# Patient Record
Sex: Female | Born: 1965 | Race: White | Hispanic: No | Marital: Married | Smoking: Former smoker
Health system: Southern US, Community
[De-identification: ages and names within clinical notes are randomized; demographics above are authoritative.]

## PROBLEM LIST (undated history)

## (undated) DIAGNOSIS — F329 Major depressive disorder, single episode, unspecified: Secondary | ICD-10-CM

## (undated) DIAGNOSIS — F32A Depression, unspecified: Secondary | ICD-10-CM

## (undated) DIAGNOSIS — E669 Obesity, unspecified: Secondary | ICD-10-CM

## (undated) HISTORY — PX: TUBAL LIGATION: SHX77

## (undated) HISTORY — PX: APPENDECTOMY: SHX54

## (undated) HISTORY — DX: Depression, unspecified: F32.A

## (undated) HISTORY — PX: TONSILLECTOMY AND ADENOIDECTOMY: SUR1326

## (undated) HISTORY — DX: Major depressive disorder, single episode, unspecified: F32.9

## (undated) HISTORY — PX: NASAL SEPTUM SURGERY: SHX37

---

## 2015-04-19 ENCOUNTER — Ambulatory Visit (INDEPENDENT_AMBULATORY_CARE_PROVIDER_SITE_OTHER): Payer: 59 | Admitting: Internal Medicine

## 2015-04-19 ENCOUNTER — Other Ambulatory Visit: Payer: Self-pay | Admitting: Internal Medicine

## 2015-04-19 ENCOUNTER — Encounter: Payer: Self-pay | Admitting: Internal Medicine

## 2015-04-19 VITALS — BP 100/60 | HR 84 | Ht 68.0 in | Wt 199.2 lb

## 2015-04-19 DIAGNOSIS — F419 Anxiety disorder, unspecified: Secondary | ICD-10-CM | POA: Insufficient documentation

## 2015-04-19 DIAGNOSIS — E782 Mixed hyperlipidemia: Secondary | ICD-10-CM | POA: Diagnosis not present

## 2015-04-19 DIAGNOSIS — M79642 Pain in left hand: Secondary | ICD-10-CM

## 2015-04-19 DIAGNOSIS — F411 Generalized anxiety disorder: Secondary | ICD-10-CM | POA: Diagnosis not present

## 2015-04-19 DIAGNOSIS — L299 Pruritus, unspecified: Secondary | ICD-10-CM | POA: Insufficient documentation

## 2015-04-19 DIAGNOSIS — M79641 Pain in right hand: Secondary | ICD-10-CM | POA: Insufficient documentation

## 2015-04-19 DIAGNOSIS — R635 Abnormal weight gain: Secondary | ICD-10-CM | POA: Insufficient documentation

## 2015-04-19 MED ORDER — BUPROPION HCL ER (XL) 450 MG PO TB24: 450.0000 mg | ORAL_TABLET | Freq: Every day | ORAL | Status: DC

## 2015-04-19 MED ORDER — BUPROPION HCL ER (XL) 450 MG PO TB24: 300.0000 mg | ORAL_TABLET | Freq: Every day | ORAL | Status: DC

## 2015-04-19 MED ORDER — BUPROPION HCL ER (XL) 150 MG PO TB24: 450.0000 mg | ORAL_TABLET | Freq: Every day | ORAL | Status: DC

## 2015-04-19 NOTE — Progress Notes (Signed)
Date:  04/19/2015   Name:  Amy HowardDebbie Jenne   DOB:  [birth year]   MRN:  [id number]  Patient presents to establish care. [place]. She is up-to-date with health maintenance, Pap smear, and mammogram done in April 2016. She declines a flu vaccine today.   Chief Complaint: Establish Care Depression        This is a chronic problem.  The current episode started more than 1 year ago (started after her divorce).   Associated symptoms include decreased concentration, irritable and decreased interest.  Associated symptoms include no fatigue, no myalgias and not sad.     The symptoms are aggravated by work stress.  Treatments tried: on bupropion for many years.  (full workup in the past - suggested an abnormal protein in her serum.) Migraine  This is a recurrent problem. The current episode started more than 1 year ago. The problem occurs intermittently (a few times per year). Associated symptoms include photophobia. Pertinent negatives include no coughing, dizziness, numbness or tingling. She has tried acetaminophen for the symptoms. The treatment provided moderate relief. (Full workup in the past - suggested an abnormal protein in her serum.)  Hand Pain  There was no injury mechanism. The pain is present in the right fingers, left fingers, left hand and right hand. The quality of the pain is described as aching. The pain is mild. The pain has been intermittent since the incident. Pertinent negatives include no chest pain, numbness or tingling. She has tried acetaminophen and NSAIDs for the symptoms. The treatment provided mild (reports being tested at [facility] L[facility], RA, etc - all negative) relief.  Rash This is a recurrent problem. The problem has been waxing and waning since onset. The affected locations include the left arm and right arm. The rash is characterized by redness and itchiness. She was exposed to nothing. Pertinent negatives include no  cough, fatigue or shortness of breath. Treatments tried: multiple treatments over the years with no benefit. (Full workup in the past - suggested an abnormal protein in her serum.)    Review of Systems  Constitutional: Positive for unexpected weight change. Negative for fatigue.  Eyes: Positive for photophobia.  Respiratory: Negative for cough, chest tightness and shortness of breath.   Cardiovascular: Negative for chest pain, palpitations and leg swelling.  Genitourinary: Negative for vaginal bleeding, menstrual problem and dyspareunia.  Musculoskeletal: Positive for joint swelling and arthralgias. Negative for myalgias and gait problem.  Skin: Positive for rash. Negative for color change.  Neurological: Negative for dizziness, tingling, tremors, syncope and numbness.  Psychiatric/Behavioral: Positive for depression and decreased concentration. Negative for hallucinations and confusion. The patient is nervous/anxious. The patient is not hyperactive.     Patient Active Problem List   Diagnosis Date Noted  . Mixed hyperlipidemia 04/19/2015  . Weight gain 04/19/2015  . Acute anxiety 04/19/2015  . Bilateral hand pain 04/19/2015  . Generalized anxiety disorder 04/19/2015    Prior to Admission medications   Medication Sig Start Date End Date Taking? Authorizing Provider  buPROPion (WELLBUTRIN XL) 300 MG 24 hr tablet Take 300 mg by mouth daily.   Yes Historical Provider, MD    Allergies  Allergen Reactions  . Oxycontin [Oxycodone Hcl]     Past Surgical History  Procedure Laterality Date  . Appendectomy    . Tubal ligation    . Tonsillectomy and adenoidectomy    . Nasal septum surgery      Social History  Substance Use Topics  . Smoking status: Former Smoker    Quit date: 04/22/2004  . Smokeless tobacco: None  . Alcohol Use: 0.0 oz/week    0 Standard drinks or equivalent per week    Medication list has been reviewed and updated.   Physical Exam  Constitutional: She is  oriented to person, place, and time. She appears well-developed and well-nourished. She is irritable. No distress.  HENT:  Head: Normocephalic and atraumatic.  Eyes: Conjunctivae are normal. Right eye exhibits no discharge. Left eye exhibits no discharge. No scleral icterus.  Cardiovascular: Normal rate, regular rhythm and normal heart sounds.   Pulmonary/Chest: Effort normal and breath sounds normal. No respiratory distress. She has no wheezes.  Musculoskeletal: Normal range of motion. She exhibits no edema.  Neurological: She is alert and oriented to person, place, and time.  Skin: Skin is warm and dry. No rash noted. There is erythema (from scratching both forearms).  Psychiatric: Her speech is normal and behavior is normal. Thought content normal. Her mood appears anxious.  Nursing note and vitals reviewed.   BP 100/60 mmHg  Pulse 84  Ht  (1.727 m)  Wt 199 lb 3.2 oz (90.357 kg)  BMI 30.30 kg/m2  Assessment and Plan: 1. Mixed hyperlipidemia Will check at [facility] in April  2. Weight gain Encouraged exercise daily  3. Bilateral hand pain Suspect OA - advil or topical rubs as needed Full workup for RA, etc done in the past  4. Generalized anxiety disorder Will increase dose of bupropion Follow up in one month if no improvement; otherwise in April - buPROPion 450 MG TB24; Take 300 mg by mouth daily.  Dispense: 30 tablet; Refill: 5  5. Pruritus Possibly worsened by stress - will see if increased dose of bupropion is of benefit.   Bari Edward, MD [facility]  04/19/2015

## 2015-04-20 ENCOUNTER — Other Ambulatory Visit: Payer: Self-pay | Admitting: Internal Medicine

## 2015-04-20 MED ORDER — BUPROPION HCL ER (XL) 150 MG PO TB24: 450.0000 mg | ORAL_TABLET | Freq: Every day | ORAL | Status: DC

## 2015-07-25 ENCOUNTER — Ambulatory Visit (INDEPENDENT_AMBULATORY_CARE_PROVIDER_SITE_OTHER): Payer: 59 | Admitting: Internal Medicine

## 2015-07-25 ENCOUNTER — Encounter: Payer: Self-pay | Admitting: Internal Medicine

## 2015-07-25 VITALS — BP 118/80 | HR 82 | Temp 98.3°F | Ht 68.0 in | Wt 199.6 lb

## 2015-07-25 DIAGNOSIS — Z124 Encounter for screening for malignant neoplasm of cervix: Secondary | ICD-10-CM | POA: Diagnosis not present

## 2015-07-25 DIAGNOSIS — Z1239 Encounter for other screening for malignant neoplasm of breast: Secondary | ICD-10-CM | POA: Diagnosis not present

## 2015-07-25 DIAGNOSIS — Z0000 Encounter for general adult medical examination without abnormal findings: Secondary | ICD-10-CM | POA: Diagnosis not present

## 2015-07-25 DIAGNOSIS — E782 Mixed hyperlipidemia: Secondary | ICD-10-CM

## 2015-07-25 DIAGNOSIS — F988 Other specified behavioral and emotional disorders with onset usually occurring in childhood and adolescence: Secondary | ICD-10-CM

## 2015-07-25 DIAGNOSIS — F909 Attention-deficit hyperactivity disorder, unspecified type: Secondary | ICD-10-CM

## 2015-07-25 DIAGNOSIS — F411 Generalized anxiety disorder: Secondary | ICD-10-CM

## 2015-07-25 MED ORDER — ATOMOXETINE HCL 40 MG PO CAPS: 80.0000 mg | ORAL_CAPSULE | Freq: Every day | ORAL | Status: DC

## 2015-07-25 NOTE — Progress Notes (Signed)
Date:  07/25/2015   Name:  Amy Joyce   DOB:  1965/12/07   MRN:  [id number]   Chief Complaint: Annual Exam Amy Joyce is a 50 y.o. female who presents today for her Complete Annual Exam. She feels well. She reports exercising some. She reports she is sleeping well. She is due for a mammogram.  She does not recall if she had a Pap last year.  Pap smears have always been normal.  Anxiety Presents for follow-up (increased dose of bupropion to 450 mg last visit) visit. The problem has been waxing and waning. Symptoms include decreased concentration and nervous/anxious behavior. Patient reports no chest pain, dizziness, palpitations or shortness of breath. Symptoms occur most days.   Treatments tried: bupropion. Compliance with prior treatments has been good (she did not tolerate 450 mg - went back to 300 mg but does not think that it is helping.  ).  Migraine  This is a recurrent problem. The problem occurs intermittently. The problem has been unchanged. Associated symptoms include photophobia. Pertinent negatives include no abdominal pain, coughing, dizziness, fever or hearing loss. She has tried acetaminophen for the symptoms. The treatment provided moderate relief.   ADD - The patient reports that she was told she had ADHD by her previous physician. However she was only ever treated with Wellbutrin. There was some discussion that she might be treated with Vyvanse. She reports that her son also has ADHD and she is convinced that she's had it her whole life. She did poorly in school was distractible but her father did not pursue treatment at that time.  Review of Systems  Constitutional: Negative for fever, chills and fatigue.  HENT: Negative for hearing loss.   Eyes: Positive for photophobia. Negative for visual disturbance.  Respiratory: Negative for cough, chest tightness and shortness of breath.   Cardiovascular: Negative for chest pain, palpitations and leg swelling.    Gastrointestinal: Negative for abdominal pain, diarrhea and constipation.  Genitourinary: Negative for dysuria, frequency, vaginal bleeding, vaginal discharge, menstrual problem and pelvic pain.  Musculoskeletal: Positive for arthralgias. Negative for joint swelling and gait problem.  Skin: Negative for wound.  Neurological: Negative for dizziness and headaches.  Psychiatric/Behavioral: Positive for decreased concentration and agitation. Negative for sleep disturbance and dysphoric mood. The patient is nervous/anxious.     Patient Active Problem List   Diagnosis Date Noted  . Mixed hyperlipidemia 04/19/2015  . Weight gain 04/19/2015  . Acute anxiety 04/19/2015  . Bilateral hand pain 04/19/2015  . Generalized anxiety disorder 04/19/2015  . Pruritus 04/19/2015    Prior to Admission medications   Medication Sig Start Date End Date Taking? Authorizing Provider  buPROPion (WELLBUTRIN XL) 150 MG 24 hr tablet Take 3 tablets (450 mg total) by mouth daily. 04/20/15   Reubin Milan, MD  cholecalciferol (VITAMIN D) 1000 units tablet Take 1,000 Units by mouth daily.    Historical Provider, MD  magnesium oxide (MAG-OX) 400 MG tablet Take 400 mg by mouth daily.    Historical Provider, MD    Allergies  Allergen Reactions  . Oxycontin [Oxycodone Hcl]     Past Surgical History  Procedure Laterality Date  . Appendectomy    . Tubal ligation    . Tonsillectomy and adenoidectomy    . Nasal septum surgery      Social History  Substance Use Topics  . Smoking status: Former Smoker    Quit date: 04/22/2004  . Smokeless tobacco: Never Used  . Alcohol Use: 0.0  oz/week    0 Standard drinks or equivalent per week     Comment: occasional consumption     Medication list has been reviewed and updated.   Physical Exam  Constitutional: She is oriented to person, place, and time. She appears well-developed and well-nourished. No distress.  HENT:  Head: Normocephalic and atraumatic.  Right  Ear: Tympanic membrane and ear canal normal.  Left Ear: Tympanic membrane and ear canal normal.  Nose: Right sinus exhibits no maxillary sinus tenderness. Left sinus exhibits no maxillary sinus tenderness.  Mouth/Throat: Uvula is midline and oropharynx is clear and moist.  Eyes: Conjunctivae and EOM are normal. Right eye exhibits no discharge. Left eye exhibits no discharge. No scleral icterus.  Neck: Normal range of motion. Carotid bruit is not present. No erythema present. No thyromegaly present.  Cardiovascular: Normal rate, regular rhythm, normal heart sounds and normal pulses.   Pulmonary/Chest: Effort normal and breath sounds normal. No respiratory distress. She has no wheezes. Right breast exhibits no mass, no nipple discharge, no skin change and no tenderness. Left breast exhibits no mass, no nipple discharge, no skin change and no tenderness.  Abdominal: Soft. Bowel sounds are normal. There is no hepatosplenomegaly. There is no tenderness. There is no CVA tenderness.  Genitourinary: Vagina normal and uterus normal. No breast swelling, tenderness, discharge or bleeding. There is no tenderness, lesion or injury on the right labia. There is no tenderness, lesion or injury on the left labia. Cervix exhibits no motion tenderness, no discharge and no friability. Right adnexum displays no mass, no tenderness and no fullness. Left adnexum displays no mass, no tenderness and no fullness.  Musculoskeletal: She exhibits no edema or tenderness.  Lymphadenopathy:    She has no cervical adenopathy.    She has no axillary adenopathy.  Neurological: She is alert and oriented to person, place, and time. She has normal reflexes. No cranial nerve deficit or sensory deficit.  Skin: Skin is warm, dry and intact. No rash noted.  Psychiatric: Her speech is normal. Thought content normal. Her mood appears anxious. She is hyperactive.  Nursing note and vitals reviewed.   BP 118/80 mmHg  Pulse 82  Temp(Src)  98.3 F (36.8 C) (Oral)  Ht 5\' 8"  (1.727 m)  Wt 199 lb 9.6 oz (90.538 kg)  BMI 30.36 kg/m2  Assessment and Plan: 1. Annual physical exam Patient declines Hep C and HIV testing - CBC with Differential/Platelet - Comprehensive metabolic panel  2. Mixed hyperlipidemia - Lipid panel  3. Generalized anxiety disorder Decreased bupropion to 150 mg per day then d/c after one month - TSH  4. Breast cancer screening - MM DIGITAL SCREENING BILATERAL; Future  5. Pap smear for cervical cancer screening - Pap IG and HPV (high risk) DNA detection  6. ADD (attention deficit disorder) Wean off bupropion Begin strattera 40 mg daily for one week then 80 mg/d - atomoxetine (STRATTERA) 40 MG capsule; Take 2 capsules (80 mg total) by mouth daily.  Dispense: 60 capsule; Refill: 1   Bari EdwardLaura Vivi Piccirilli, MD [facility]  07/25/2015

## 2015-07-25 NOTE — Patient Instructions (Signed)
Breast Self-Awareness Practicing breast self-awareness may pick up problems early, prevent significant medical complications, and possibly save your life. By practicing breast self-awareness, you can become familiar with how your breasts look and feel and if your breasts are changing. This allows you to notice changes early. It can also offer you some reassurance that your breast health is good. One way to learn what is normal for your breasts and whether your breasts are changing is to do a breast self-exam. If you find a lump or something that was not present in the past, it is best to contact your caregiver right away. Other findings that should be evaluated by your caregiver include nipple discharge, especially if it is bloody; skin changes or reddening; areas where the skin seems to be pulled in (retracted); or new lumps and bumps. Breast pain is seldom associated with cancer (malignancy), but should also be evaluated by a caregiver. HOW TO PERFORM A BREAST SELF-EXAM The best time to examine your breasts is 5-7 days after your menstrual period is over. During menstruation, the breasts are lumpier, and it may be more difficult to pick up changes. If you do not menstruate, have reached menopause, or had your uterus removed (hysterectomy), you should examine your breasts at regular intervals, such as monthly. If you are breastfeeding, examine your breasts after a feeding or after using a breast pump. Breast implants do not decrease the risk for lumps or tumors, so continue to perform breast self-exams as recommended. Talk to your caregiver about how to determine the difference between the implant and breast tissue. Also, talk about the amount of pressure you should use during the exam. Over time, you will become more familiar with the variations of your breasts and more comfortable with the exam. A breast self-exam requires you to remove all your clothes above the waist. 1. Look at your breasts and nipples.  Stand in front of a mirror in a room with good lighting. With your hands on your hips, push your hands firmly downward. Look for a difference in shape, contour, and size from one breast to the other (asymmetry). Asymmetry includes puckers, dips, or bumps. Also, look for skin changes, such as reddened or scaly areas on the breasts. Look for nipple changes, such as discharge, dimpling, repositioning, or redness. 2. Carefully feel your breasts. This is best done either in the shower or tub while using soapy water or when flat on your back. Place the arm (on the side of the breast you are examining) above your head. Use the pads (not the fingertips) of your three middle fingers on your opposite hand to feel your breasts. Start in the underarm area and use  inch (2 cm) overlapping circles to feel your breast. Use 3 different levels of pressure (light, medium, and firm pressure) at each circle before moving to the next circle. The light pressure is needed to feel the tissue closest to the skin. The medium pressure will help to feel breast tissue a little deeper, while the firm pressure is needed to feel the tissue close to the ribs. Continue the overlapping circles, moving downward over the breast until you feel your ribs below your breast. Then, move one finger-width towards the center of the body. Continue to use the  inch (2 cm) overlapping circles to feel your breast as you move slowly up toward the collar bone (clavicle) near the base of the neck. Continue the up and down exam using all 3 pressures until you reach the   middle of the chest. Do this with each breast, carefully feeling for lumps or changes. 3.  Keep a written record with breast changes or normal findings for each breast. By writing this information down, you do not need to depend only on memory for size, tenderness, or location. Write down where you are in your menstrual cycle, if you are still menstruating. Breast tissue can have some lumps or  thick tissue. However, see your caregiver if you find anything that concerns you.  SEEK MEDICAL CARE IF:  You see a change in shape, contour, or size of your breasts or nipples.   You see skin changes, such as reddened or scaly areas on the breasts or nipples.   You have an unusual discharge from your nipples.   You feel a new lump or unusually thick areas.    This information is not intended to replace advice given to you by your health care provider. Make sure you discuss any questions you have with your health care provider.   Document Released: 04/08/2005 Document Revised: 03/25/2012 Document Reviewed: 07/24/2011 Elsevier Interactive Patient Education 2016 Elsevier Inc.  

## 2015-07-26 LAB — CBC WITH DIFFERENTIAL/PLATELET
Basophils Absolute: 0.1 10*3/uL (ref 0.0–0.2)
Basos: 1 %
EOS (ABSOLUTE): 0.2 10*3/uL (ref 0.0–0.4)
EOS: 4 %
Hematocrit: 40 % (ref 34.0–46.6)
Hemoglobin: 13.5 g/dL (ref 11.1–15.9)
IMMATURE GRANULOCYTES: 0 %
Immature Grans (Abs): 0 10*3/uL (ref 0.0–0.1)
Lymphocytes Absolute: 1.6 10*3/uL (ref 0.7–3.1)
Lymphs: 30 %
MCH: 29.3 pg (ref 26.6–33.0)
MCHC: 33.8 g/dL (ref 31.5–35.7)
MCV: 87 fL (ref 79–97)
MONOS ABS: 0.4 10*3/uL (ref 0.1–0.9)
Monocytes: 7 %
NEUTROS PCT: 58 %
Neutrophils Absolute: 3.2 10*3/uL (ref 1.4–7.0)
PLATELETS: 335 10*3/uL (ref 150–379)
RBC: 4.61 x10E6/uL (ref 3.77–5.28)
RDW: 13.8 % (ref 12.3–15.4)
WBC: 5.4 10*3/uL (ref 3.4–10.8)

## 2015-07-26 LAB — COMPREHENSIVE METABOLIC PANEL
ALT: 26 IU/L (ref 0–32)
AST: 21 IU/L (ref 0–40)
Albumin/Globulin Ratio: 1.8 (ref 1.2–2.2)
Albumin: 4.6 g/dL (ref 3.5–5.5)
Alkaline Phosphatase: 70 IU/L (ref 39–117)
BUN/Creatinine Ratio: 20 (ref 9–23)
BUN: 28 mg/dL — ABNORMAL HIGH (ref 6–24)
Bilirubin Total: 0.3 mg/dL (ref 0.0–1.2)
CALCIUM: 9.5 mg/dL (ref 8.7–10.2)
CO2: 25 mmol/L (ref 18–29)
CREATININE: 1.43 mg/dL — AB (ref 0.57–1.00)
Chloride: 104 mmol/L (ref 96–106)
GFR calc Af Amer: 49 mL/min/{1.73_m2} — ABNORMAL LOW (ref >59)
GFR, EST NON AFRICAN AMERICAN: 42 mL/min/{1.73_m2} — AB (ref >59)
Globulin, Total: 2.6 g/dL (ref 1.5–4.5)
Glucose: 91 mg/dL (ref 65–99)
POTASSIUM: 4.6 mmol/L (ref 3.5–5.2)
Sodium: 144 mmol/L (ref 134–144)
Total Protein: 7.2 g/dL (ref 6.0–8.5)

## 2015-07-26 LAB — LIPID PANEL
CHOL/HDL RATIO: 3.9 ratio (ref 0.0–4.4)
Cholesterol, Total: 248 mg/dL — ABNORMAL HIGH (ref 100–199)
HDL: 64 mg/dL (ref >39)
LDL CALC: 155 mg/dL — AB (ref 0–99)
TRIGLYCERIDES: 147 mg/dL (ref 0–149)
VLDL Cholesterol Cal: 29 mg/dL (ref 5–40)

## 2015-07-26 LAB — TSH: TSH: 2.67 u[IU]/mL (ref 0.450–4.500)

## 2015-07-28 ENCOUNTER — Telehealth: Payer: Self-pay

## 2015-07-28 LAB — PAP IG AND HPV HIGH-RISK: PAP SMEAR COMMENT: 0

## 2015-07-28 LAB — HPV, LOW VOLUME (REFLEX): HPV, LOW VOL REFLEX: NEGATIVE

## 2015-07-28 NOTE — Telephone Encounter (Signed)
Tried calling patient. Patient answered no service.

## 2015-07-28 NOTE — Telephone Encounter (Signed)
-----   Message from Reubin MilanLaura H Berglund, MD sent at 07/28/2015  8:00 AM EDT ----- Kidney function is slightly decreased - drink sufficient fluids to maintain hydration and schedule a follow up appt in 2 months.  All other labs are normal.  Cholesterol is slightly high but no medication is recommended at this time.

## 2015-08-11 NOTE — Telephone Encounter (Signed)
Tried to call patient repeatedly without service. Patient will contact office.

## 2015-08-23 ENCOUNTER — Telehealth: Payer: Self-pay

## 2015-08-23 DIAGNOSIS — F988 Other specified behavioral and emotional disorders with onset usually occurring in childhood and adolescence: Secondary | ICD-10-CM

## 2015-08-23 MED ORDER — ATOMOXETINE HCL 80 MG PO CAPS: 80.0000 mg | ORAL_CAPSULE | Freq: Every day | ORAL | Status: DC

## 2015-08-23 NOTE — Telephone Encounter (Signed)
Left mess 

## 2015-08-23 NOTE — Telephone Encounter (Signed)
Strattera 80 is working well. Does she stay at 3280 or will this go up? Wellbutrin 150 also well.  Is she supposed to stay on this or drop it since Wilhemena DurieStrattera is working well. Has Rx of 40 Strattera to take 2 daily if that's what you want her to do. All refills now that we send after that Rx will be mail order.  Call back number: [phone].

## 2015-08-23 NOTE — Telephone Encounter (Signed)
She can stop Wellbutrin and continue Strattera 80 mg.  She can take 2 40 mg until the mail order Rx arrives.

## 2015-08-25 NOTE — Telephone Encounter (Signed)
Patient upset as she called and got no call back on 08/24/2015. I explained I was out of office and apologized. She wanted to get a call back to see why she has to stop Wellbutrin while on Strattera and had a coupon that she can not use now that [facility] has mailed new Rx and caused her to get a bill of 175 dollars. I called [facility] and they said she can refuse the Rx when it arrives and after 5-10 days they will fix this and she can use the local pharmacy. Patient will call back if she does not have enough of the 40mg . [facility]

## 2015-08-28 ENCOUNTER — Other Ambulatory Visit: Payer: Self-pay | Admitting: Internal Medicine

## 2015-08-28 DIAGNOSIS — F988 Other specified behavioral and emotional disorders with onset usually occurring in childhood and adolescence: Secondary | ICD-10-CM

## 2015-08-28 MED ORDER — ATOMOXETINE HCL 80 MG PO CAPS: 80.0000 mg | ORAL_CAPSULE | Freq: Every day | ORAL | Status: DC

## 2015-09-13 ENCOUNTER — Telehealth: Payer: Self-pay

## 2015-09-13 NOTE — Telephone Encounter (Signed)
Advised per Dr. Judithann GravesBerglund can take Wellbutrin with Lyrica

## 2015-11-07 ENCOUNTER — Other Ambulatory Visit: Payer: Self-pay | Admitting: Internal Medicine

## 2016-02-16 ENCOUNTER — Other Ambulatory Visit: Payer: Self-pay | Admitting: Internal Medicine

## 2016-02-16 DIAGNOSIS — F988 Other specified behavioral and emotional disorders with onset usually occurring in childhood and adolescence: Secondary | ICD-10-CM

## 2016-02-19 NOTE — Telephone Encounter (Signed)
pts coming in on 5/2

## 2016-08-21 ENCOUNTER — Encounter: Payer: Self-pay | Admitting: Internal Medicine

## 2016-08-21 ENCOUNTER — Ambulatory Visit (INDEPENDENT_AMBULATORY_CARE_PROVIDER_SITE_OTHER): Payer: 59 | Admitting: Internal Medicine

## 2016-08-21 ENCOUNTER — Other Ambulatory Visit: Payer: Self-pay | Admitting: Internal Medicine

## 2016-08-21 VITALS — BP 116/68 | HR 82 | Ht 68.0 in | Wt 195.2 lb

## 2016-08-21 DIAGNOSIS — Z1239 Encounter for other screening for malignant neoplasm of breast: Secondary | ICD-10-CM

## 2016-08-21 DIAGNOSIS — Z6829 Body mass index (BMI) 29.0-29.9, adult: Secondary | ICD-10-CM

## 2016-08-21 DIAGNOSIS — Z1231 Encounter for screening mammogram for malignant neoplasm of breast: Secondary | ICD-10-CM | POA: Diagnosis not present

## 2016-08-21 DIAGNOSIS — F411 Generalized anxiety disorder: Secondary | ICD-10-CM

## 2016-08-21 DIAGNOSIS — Z0000 Encounter for general adult medical examination without abnormal findings: Secondary | ICD-10-CM | POA: Diagnosis not present

## 2016-08-21 DIAGNOSIS — E782 Mixed hyperlipidemia: Secondary | ICD-10-CM | POA: Diagnosis not present

## 2016-08-21 LAB — POCT URINALYSIS DIPSTICK
BILIRUBIN UA: NEGATIVE
Blood, UA: NEGATIVE
Glucose, UA: NEGATIVE
KETONES UA: NEGATIVE
Nitrite, UA: POSITIVE
PH UA: 6 (ref 5.0–8.0)
Protein, UA: NEGATIVE
SPEC GRAV UA: 1.01 (ref 1.010–1.025)
Urobilinogen, UA: 0.2 E.U./dL

## 2016-08-21 MED ORDER — PHENTERMINE HCL 37.5 MG PO TABS: 37.5000 mg | ORAL_TABLET | Freq: Every day | ORAL | 1 refills | Status: DC

## 2016-08-21 NOTE — Patient Instructions (Signed)
Breast Self-Awareness Breast self-awareness means being familiar with how your breasts look and feel. It involves checking your breasts regularly and reporting any changes to your health care provider. Practicing breast self-awareness is important. A change in your breasts can be a sign of a serious medical problem. Being familiar with how your breasts look and feel allows you to find any problems early, when treatment is more likely to be successful. All women should practice breast self-awareness, including women who have had breast implants. How to do a breast self-exam One way to learn what is normal for your breasts and whether your breasts are changing is to do a breast self-exam. To do a breast self-exam: Look for Changes   1. Remove all the clothing above your waist. 2. Stand in front of a mirror in a room with good lighting. 3. Put your hands on your hips. 4. Push your hands firmly downward. 5. Compare your breasts in the mirror. Look for differences between them (asymmetry), such as:  Differences in shape.  Differences in size.  Puckers, dips, and bumps in one breast and not the other. 6. Look at each breast for changes in your skin, such as:  Redness.  Scaly areas. 7. Look for changes in your nipples, such as:  Discharge.  Bleeding.  Dimpling.  Redness.  A change in position. Feel for Changes   Carefully feel your breasts for lumps and changes. It is best to do this while lying on your back on the floor and again while sitting or standing in the shower or tub with soapy water on your skin. Feel each breast in the following way:  Place the arm on the side of the breast you are examining above your head.  Feel your breast with the other hand.  Start in the nipple area and make  inch (2 cm) overlapping circles to feel your breast. Use the pads of your three middle fingers to do this. Apply light pressure, then medium pressure, then firm pressure. The light pressure  will allow you to feel the tissue closest to the skin. The medium pressure will allow you to feel the tissue that is a little deeper. The firm pressure will allow you to feel the tissue close to the ribs.  Continue the overlapping circles, moving downward over the breast until you feel your ribs below your breast.  Move one finger-width toward the center of the body. Continue to use the  inch (2 cm) overlapping circles to feel your breast as you move slowly up toward your collarbone.  Continue the up and down exam using all three pressures until you reach your armpit. Write Down What You Find   Write down what is normal for each breast and any changes that you find. Keep a written record with breast changes or normal findings for each breast. By writing this information down, you do not need to depend only on memory for size, tenderness, or location. Write down where you are in your menstrual cycle, if you are still menstruating. If you are having trouble noticing differences in your breasts, do not get discouraged. With time you will become more familiar with the variations in your breasts and more comfortable with the exam. How often should I examine my breasts? Examine your breasts every month. If you are breastfeeding, the best time to examine your breasts is after a feeding or after using a breast pump. If you menstruate, the best time to examine your breasts is 5-7 days   after your period is over. During your period, your breasts are lumpier, and it may be more difficult to notice changes. When should I see my health care provider? See your health care provider if you notice:  A change in shape or size of your breasts or nipples.  A change in the skin of your breast or nipples, such as a reddened or scaly area.  Unusual discharge from your nipples.  A lump or thick area that was not there before.  Pain in your breasts.  Anything that concerns you. This information is not intended to  replace advice given to you by your health care provider. Make sure you discuss any questions you have with your health care provider. Document Released: 04/08/2005 Document Revised: 09/14/2015 Document Reviewed: 02/26/2015 Elsevier Interactive Patient Education  2017 Elsevier Inc.  

## 2016-08-21 NOTE — Progress Notes (Signed)
Date:  08/21/2016   Name:  Amy Joyce   DOB:  09-10-1965   MRN:  [id number]   Chief Complaint: Annual Exam (breast exam. ) Amy Joyce is a 51 y.o. female who presents today for her Complete Annual Exam. She feels well. She reports exercising none. She reports she is sleeping fairly well.  She is overdue for mammogram - she denies breast issues.  She will try to schedule.  Anxiety  Patient reports no chest pain, dizziness, nervous/anxious behavior, palpitations or shortness of breath.    Weight - she is disturbed by her weight. She has tried [employer] for the past 2 months and has only lost a few pounds.  Her lower back aches all the time and she has been evaluated - instructed to lose weight.  Wt Readings from Last 3 Encounters:  08/21/16 195 lb 3.2 oz (88.5 kg)  07/25/15 199 lb 9.6 oz (90.5 kg)  04/19/15 199 lb 3.2 oz (90.4 kg)     Review of Systems  Constitutional: Negative for chills, fatigue and fever.  HENT: Negative for congestion, hearing loss, tinnitus, trouble swallowing and voice change.   Eyes: Negative for visual disturbance.  Respiratory: Negative for cough, chest tightness, shortness of breath and wheezing.   Cardiovascular: Negative for chest pain, palpitations and leg swelling.  Gastrointestinal: Negative for abdominal pain, constipation, diarrhea and vomiting.  Endocrine: Negative for polydipsia and polyuria.  Genitourinary: Negative for dysuria, frequency, genital sores, vaginal bleeding and vaginal discharge.  Musculoskeletal: Positive for back pain. Negative for arthralgias, gait problem and joint swelling.  Skin: Negative for color change and rash.  Neurological: Negative for dizziness, tremors, light-headedness and headaches.  Hematological: Negative for adenopathy. Does not bruise/bleed easily.  Psychiatric/Behavioral: Negative for dysphoric mood and sleep disturbance. The patient is not nervous/anxious.     Patient Active Problem List   Diagnosis  Date Noted  . ADD (attention deficit disorder) 07/25/2015  . Mixed hyperlipidemia 04/19/2015  . Weight gain 04/19/2015  . Acute anxiety 04/19/2015  . Bilateral hand pain 04/19/2015  . Generalized anxiety disorder 04/19/2015  . Pruritus 04/19/2015    Prior to Admission medications   Medication Sig Start Date End Date Taking? Authorizing Provider  Biotin w/ Vitamins C & E (HAIR/SKIN/NAILS PO) Take by mouth.   Yes Historical Provider, MD  buPROPion (WELLBUTRIN XL) 150 MG 24 hr tablet Take 3 tablets by mouth  daily 11/07/15  Yes Reubin Milan, MD  Calcium Citrate-Vitamin D (CALCIUM CITRATE + D3 PO) Take by mouth.   Yes Historical Provider, MD  Cyanocobalamin (VITAMIN B 12 PO) Take by mouth.   Yes Historical Provider, MD  magnesium oxide (MAG-OX) 400 MG tablet Take 500 mg by mouth 2 (two) times daily.    Yes Historical Provider, MD    Allergies  Allergen Reactions  . Oxycontin [Oxycodone Hcl] Itching    Past Surgical History:  Procedure Laterality Date  . APPENDECTOMY    . NASAL SEPTUM SURGERY    . TONSILLECTOMY AND ADENOIDECTOMY    . TUBAL LIGATION      Social History  Substance Use Topics  . Smoking status: Former Smoker    Quit date: 04/22/2004  . Smokeless tobacco: Never Used  . Alcohol use 0.0 oz/week     Comment: occasional consumption   Depression screen [facility] 2/9 08/21/2016  Decreased Interest 0  Down, Depressed, Hopeless 0  PHQ - 2 Score 0     Medication list has been reviewed and updated.  Physical Exam  Constitutional: She is oriented to person, place, and time. She appears well-developed and well-nourished. No distress.  HENT:  Head: Normocephalic and atraumatic.  Right Ear: Tympanic membrane and ear canal normal.  Left Ear: Tympanic membrane and ear canal normal.  Nose: Right sinus exhibits no maxillary sinus tenderness. Left sinus exhibits no maxillary sinus tenderness.  Mouth/Throat: Uvula is midline and oropharynx is clear and moist.  Eyes:  Conjunctivae and EOM are normal. Right eye exhibits no discharge. Left eye exhibits no discharge. No scleral icterus.  Neck: Normal range of motion. Carotid bruit is not present. No erythema present. No thyromegaly present.  Cardiovascular: Normal rate, regular rhythm, normal heart sounds and normal pulses.   Pulmonary/Chest: Effort normal. No respiratory distress. She has no wheezes. Right breast exhibits no mass, no nipple discharge, no skin change and no tenderness. Left breast exhibits no mass, no nipple discharge, no skin change and no tenderness.  Abdominal: Soft. Bowel sounds are normal. There is no hepatosplenomegaly. There is no tenderness. There is no CVA tenderness.  Musculoskeletal: Normal range of motion.  Lymphadenopathy:    She has no cervical adenopathy.    She has no axillary adenopathy.  Neurological: She is alert and oriented to person, place, and time. She has normal reflexes. No cranial nerve deficit or sensory deficit.  Skin: Skin is warm, dry and intact. No rash noted.  Psychiatric: She has a normal mood and affect. Her speech is normal and behavior is normal. Thought content normal.  Nursing note and vitals reviewed.   BP 116/68 (BP Location: Right Arm, Patient Position: Sitting, Cuff Size: Normal)   Pulse 82   Ht  (1.727 m)   Wt 195 lb 3.2 oz (88.5 kg)   SpO2 98%   BMI 29.68 kg/m   Assessment and Plan: 1. Annual physical exam Continue [employer] diet and begin exercise when able Colonoscopy and Pap are up to date UA shows asx bacteruria - CBC with Differential/Platelet - POCT urinalysis dipstick  2. Breast cancer screening Schedule mammogram  3. Generalized anxiety disorder Doing well on Welbutrin - TSH  4. Mixed hyperlipidemia Continue low fat diet - Comprehensive metabolic panel - Lipid panel  5. BMI 29.0-29.9,adult Trial of phentermine   Meds ordered this encounter  Medications  . phentermine (ADIPEX-P) 37.5 MG tablet    Sig: Take 1 tablet  (37.5 mg total) by mouth daily before breakfast.    Dispense:  30 tablet    Refill:  1    Bari Edward, MD [facility]  08/21/2016

## 2016-08-22 LAB — COMPREHENSIVE METABOLIC PANEL
A/G RATIO: 1.8 (ref 1.2–2.2)
ALBUMIN: 4.6 g/dL (ref 3.5–5.5)
ALT: 45 IU/L — AB (ref 0–32)
AST: 20 IU/L (ref 0–40)
Alkaline Phosphatase: 66 IU/L (ref 39–117)
BUN / CREAT RATIO: 21 (ref 9–23)
BUN: 22 mg/dL (ref 6–24)
Bilirubin Total: 0.3 mg/dL (ref 0.0–1.2)
CHLORIDE: 101 mmol/L (ref 96–106)
CO2: 25 mmol/L (ref 18–29)
Calcium: 9.8 mg/dL (ref 8.7–10.2)
Creatinine, Ser: 1.07 mg/dL — ABNORMAL HIGH (ref 0.57–1.00)
GFR, EST AFRICAN AMERICAN: 69 mL/min/{1.73_m2} (ref >59)
GFR, EST NON AFRICAN AMERICAN: 60 mL/min/{1.73_m2} (ref >59)
GLOBULIN, TOTAL: 2.6 g/dL (ref 1.5–4.5)
GLUCOSE: 83 mg/dL (ref 65–99)
POTASSIUM: 4.5 mmol/L (ref 3.5–5.2)
SODIUM: 141 mmol/L (ref 134–144)
TOTAL PROTEIN: 7.2 g/dL (ref 6.0–8.5)

## 2016-08-22 LAB — CBC WITH DIFFERENTIAL/PLATELET
BASOS ABS: 0 10*3/uL (ref 0.0–0.2)
Basos: 1 %
EOS (ABSOLUTE): 0.1 10*3/uL (ref 0.0–0.4)
Eos: 3 %
HEMOGLOBIN: 13.5 g/dL (ref 11.1–15.9)
Hematocrit: 40.2 % (ref 34.0–46.6)
Immature Grans (Abs): 0 10*3/uL (ref 0.0–0.1)
Immature Granulocytes: 0 %
LYMPHS ABS: 1.7 10*3/uL (ref 0.7–3.1)
Lymphs: 34 %
MCH: 28.7 pg (ref 26.6–33.0)
MCHC: 33.6 g/dL (ref 31.5–35.7)
MCV: 86 fL (ref 79–97)
MONOS ABS: 0.4 10*3/uL (ref 0.1–0.9)
Monocytes: 8 %
NEUTROS ABS: 2.8 10*3/uL (ref 1.4–7.0)
Neutrophils: 54 %
Platelets: 314 10*3/uL (ref 150–379)
RBC: 4.7 x10E6/uL (ref 3.77–5.28)
RDW: 14.1 % (ref 12.3–15.4)
WBC: 5 10*3/uL (ref 3.4–10.8)

## 2016-08-22 LAB — TSH: TSH: 4.11 u[IU]/mL (ref 0.450–4.500)

## 2016-08-22 LAB — LIPID PANEL
CHOL/HDL RATIO: 4.1 ratio (ref 0.0–4.4)
Cholesterol, Total: 215 mg/dL — ABNORMAL HIGH (ref 100–199)
HDL: 52 mg/dL (ref >39)
LDL Calculated: 123 mg/dL — ABNORMAL HIGH (ref 0–99)
Triglycerides: 198 mg/dL — ABNORMAL HIGH (ref 0–149)
VLDL Cholesterol Cal: 40 mg/dL (ref 5–40)

## 2016-10-02 ENCOUNTER — Encounter: Payer: Self-pay | Admitting: Internal Medicine

## 2016-10-02 ENCOUNTER — Ambulatory Visit (INDEPENDENT_AMBULATORY_CARE_PROVIDER_SITE_OTHER): Payer: 59 | Admitting: Internal Medicine

## 2016-10-02 ENCOUNTER — Ambulatory Visit
Admission: RE | Admit: 2016-10-02 | Discharge: 2016-10-02 | Disposition: A | Discharge status: Home / Self Care | Payer: 59 | Source: Ambulatory Visit | Attending: Internal Medicine | Admitting: Internal Medicine

## 2016-10-02 VITALS — BP 90/70 | HR 76 | Temp 98.2°F | Resp 17 | Ht 68.0 in | Wt 191.0 lb

## 2016-10-02 DIAGNOSIS — R635 Abnormal weight gain: Secondary | ICD-10-CM

## 2016-10-02 DIAGNOSIS — Z1239 Encounter for other screening for malignant neoplasm of breast: Secondary | ICD-10-CM

## 2016-10-02 DIAGNOSIS — Z1231 Encounter for screening mammogram for malignant neoplasm of breast: Secondary | ICD-10-CM | POA: Insufficient documentation

## 2016-10-02 MED ORDER — PHENTERMINE HCL 37.5 MG PO TABS: 37.5000 mg | ORAL_TABLET | Freq: Every day | ORAL | 1 refills | Status: DC

## 2016-10-02 NOTE — Progress Notes (Signed)
Date:  10/02/2016   Name:  Amy Joyce   DOB:  05/05/1965   MRN:  [id number]   Chief Complaint: Obesity Patient presents for follow up after starting medication for weight loss.   She has done well - continues on [employer] and exercise and has lost 5 lbs.  She denies side effects such as tachycardia, elevated BP or insomnia. We discussed continuing medication for 6 months.  Wt Readings from Last 3 Encounters:  10/02/16 191 lb (86.6 kg)  08/21/16 195 lb 3.2 oz (88.5 kg)  07/25/15 199 lb 9.6 oz (90.5 kg)       Review of Systems  Constitutional: Negative for chills, fatigue and fever.  Respiratory: Negative for chest tightness and shortness of breath.   Cardiovascular: Negative for chest pain and palpitations.  Gastrointestinal: Negative for abdominal pain.  Neurological: Negative for dizziness and headaches.  Psychiatric/Behavioral: Negative for sleep disturbance. The patient is not nervous/anxious.     Patient Active Problem List   Diagnosis Date Noted  . ADD (attention deficit disorder) 07/25/2015  . Mixed hyperlipidemia 04/19/2015  . Weight gain 04/19/2015  . Bilateral hand pain 04/19/2015  . Generalized anxiety disorder 04/19/2015  . Pruritus 04/19/2015    Prior to Admission medications   Medication Sig Start Date End Date Taking? Authorizing Provider  Biotin w/ Vitamins C & E (HAIR/SKIN/NAILS PO) Take by mouth.    [provider]  buPROPion (WELLBUTRIN XL) 150 MG 24 hr tablet Take 3 tablets by mouth  daily 11/07/15   Reubin Milan, MD  Calcium Citrate-Vitamin D (CALCIUM CITRATE + D3 PO) Take by mouth.    [provider]  Cyanocobalamin (VITAMIN B 12 PO) Take by mouth.    [provider]  magnesium oxide (MAG-OX) 400 MG tablet Take 500 mg by mouth 2 (two) times daily.     [provider]  phentermine (ADIPEX-P) 37.5 MG tablet Take 1 tablet (37.5 mg total) by mouth daily before breakfast. 08/21/16   Reubin Milan, MD     Allergies  Allergen Reactions  . Oxycontin [Oxycodone Hcl] Itching    Past Surgical History:  Procedure Laterality Date  . APPENDECTOMY    . NASAL SEPTUM SURGERY    . TONSILLECTOMY AND ADENOIDECTOMY    . TUBAL LIGATION      Social History  Substance Use Topics  . Smoking status: Former Smoker    Quit date: 04/22/2004  . Smokeless tobacco: Never Used  . Alcohol use 0.0 oz/week     Comment: occasional consumption     Medication list has been reviewed and updated.   Physical Exam  Constitutional: She is oriented to person, place, and time. She appears well-developed. No distress.  HENT:  Head: Normocephalic and atraumatic.  Neck: Normal range of motion. Neck supple.  Cardiovascular: Normal rate, regular rhythm and normal heart sounds.   Pulmonary/Chest: Effort normal and breath sounds normal. No respiratory distress. She has no wheezes.  Musculoskeletal: She exhibits no edema.  Neurological: She is alert and oriented to person, place, and time.  Skin: Skin is warm and dry. No rash noted.  Psychiatric: She has a normal mood and affect. Her behavior is normal. Thought content normal.  Nursing note and vitals reviewed.   BP 90/70 (BP Location: Right Arm, Patient Position: Sitting, Cuff Size: Normal)   Pulse 76   Temp 98.2 F (36.8 C) (Oral)   Resp 17   Ht 5\' 8"  (1.727 m)   Wt 191 lb (86.6  kg)   SpO2 97%   BMI 29.04 kg/m   Assessment and Plan: 1. Weight gain Steady loss noted on phentermine without side effects Wt check only in 2 months and refill   Meds ordered this encounter  Medications  . phentermine (ADIPEX-P) 37.5 MG tablet    Sig: Take 1 tablet (37.5 mg total) by mouth daily before breakfast.    Dispense:  30 tablet    Refill:  1    Bari EdwardLaura Deeann Servidio, MD [facility]  10/02/2016

## 2016-10-15 ENCOUNTER — Inpatient Hospital Stay
Admission: RE | Admit: 2016-10-15 | Discharge: 2016-10-15 | Disposition: A | Discharge status: Home / Self Care | Payer: Self-pay | Source: Ambulatory Visit | Attending: *Deleted | Admitting: *Deleted

## 2016-10-15 ENCOUNTER — Other Ambulatory Visit: Payer: Self-pay | Admitting: *Deleted

## 2016-10-15 DIAGNOSIS — Z9289 Personal history of other medical treatment: Secondary | ICD-10-CM

## 2016-12-04 ENCOUNTER — Ambulatory Visit (INDEPENDENT_AMBULATORY_CARE_PROVIDER_SITE_OTHER): Payer: 59 | Admitting: Internal Medicine

## 2016-12-04 ENCOUNTER — Encounter: Payer: Self-pay | Admitting: Internal Medicine

## 2016-12-04 VITALS — BP 112/76 | HR 91 | Ht 68.0 in | Wt 182.0 lb

## 2016-12-04 DIAGNOSIS — F411 Generalized anxiety disorder: Secondary | ICD-10-CM

## 2016-12-04 DIAGNOSIS — R635 Abnormal weight gain: Secondary | ICD-10-CM | POA: Diagnosis not present

## 2016-12-04 MED ORDER — PHENTERMINE HCL 37.5 MG PO TABS: 37.5000 mg | ORAL_TABLET | Freq: Every day | ORAL | 2 refills | Status: DC

## 2016-12-04 NOTE — Progress Notes (Signed)
Date:  12/04/2016   Name:  Amy Joyce   DOB:  1966/02/28   MRN:  [id number]   Chief Complaint: Weight Check (Pt has lost 9lbs. ) Anxiety  Presents for follow-up (taking welbutrin with good response) visit. Patient reports no chest pain, dizziness, nervous/anxious behavior, palpitations or shortness of breath. Symptoms occur occasionally. The quality of sleep is good.    Overweight Here to recheck weight and BP on phentermine.  She started in May and has done well.  Her starting weight was 199 lbs.  Weight today 182 lbs.  Total weight loss is almost 20 lbs.  She is very satisfied but wants to lose another 40 lbs.  No side effects to medication.    Review of Systems  Constitutional: Negative for chills, fatigue and fever.  Respiratory: Negative for chest tightness and shortness of breath.   Cardiovascular: Negative for chest pain and palpitations.  Gastrointestinal: Negative for abdominal pain.  Neurological: Negative for dizziness and headaches.  Psychiatric/Behavioral: Negative for sleep disturbance. The patient is not nervous/anxious.     Patient Active Problem List   Diagnosis Date Noted  . ADD (attention deficit disorder) 07/25/2015  . Mixed hyperlipidemia 04/19/2015  . Weight gain 04/19/2015  . Bilateral hand pain 04/19/2015  . Generalized anxiety disorder 04/19/2015  . Pruritus 04/19/2015    Prior to Admission medications   Medication Sig Start Date End Date Taking? Authorizing Provider  Biotin w/ Vitamins C & E (HAIR/SKIN/NAILS PO) Take by mouth.    [provider]  buPROPion (WELLBUTRIN XL) 150 MG 24 hr tablet Take 3 tablets by mouth  daily 11/07/15   Reubin Milan, MD  Calcium Citrate-Vitamin D (CALCIUM CITRATE + D3 PO) Take by mouth.    [provider]  Cyanocobalamin (VITAMIN B 12 PO) Take by mouth.    [provider]  magnesium oxide (MAG-OX) 400 MG tablet Take 500 mg by mouth 2 (two) times daily.     [provider]    phentermine (ADIPEX-P) 37.5 MG tablet Take 1 tablet (37.5 mg total) by mouth daily before breakfast. 10/02/16   Reubin Milan, MD    Allergies  Allergen Reactions  . Oxycontin [Oxycodone Hcl] Itching    Past Surgical History:  Procedure Laterality Date  . APPENDECTOMY    . NASAL SEPTUM SURGERY    . TONSILLECTOMY AND ADENOIDECTOMY    . TUBAL LIGATION      Social History  Substance Use Topics  . Smoking status: Former Smoker    Quit date: 04/22/2004  . Smokeless tobacco: Never Used  . Alcohol use 0.0 oz/week     Comment: occasional consumption     Medication list has been reviewed and updated.   Physical Exam  Constitutional: She is oriented to person, place, and time. She appears well-developed. No distress.  HENT:  Head: Normocephalic and atraumatic.  Neck: Normal range of motion. Neck supple.  Cardiovascular: Normal rate, regular rhythm and normal heart sounds.   Pulmonary/Chest: Effort normal and breath sounds normal. No respiratory distress. She has no wheezes.  Musculoskeletal: Normal range of motion. She exhibits no edema.  Neurological: She is alert and oriented to person, place, and time.  Skin: Skin is warm and dry. No rash noted.  Psychiatric: She has a normal mood and affect. Her behavior is normal. Thought content normal.  Nursing note and vitals reviewed.   BP 112/76   Pulse 91   Ht 5\' 8"  (1.727 m)   Wt 182  lb (82.6 kg)   SpO2 99%   BMI 27.67 kg/m   Assessment and Plan: 1. Weight gain Doing well with exercise and diet as well Will complete 6 mo tx after this next Rx - phentermine (ADIPEX-P) 37.5 MG tablet; Take 1 tablet (37.5 mg total) by mouth daily before breakfast.  Dispense: 30 tablet; Refill: 2  2. Generalized anxiety disorder Doing well on bupropion   Meds ordered this encounter  Medications  . phentermine (ADIPEX-P) 37.5 MG tablet    Sig: Take 1 tablet (37.5 mg total) by mouth daily before breakfast.    Dispense:  30 tablet     Refill:  2    Bari EdwardLaura Nicholaus Steinke, MD [facility]  12/04/2016

## 2017-01-09 ENCOUNTER — Other Ambulatory Visit: Payer: Self-pay | Admitting: Internal Medicine

## 2017-08-20 ENCOUNTER — Ambulatory Visit (INDEPENDENT_AMBULATORY_CARE_PROVIDER_SITE_OTHER): Payer: 59 | Admitting: Internal Medicine

## 2017-08-20 ENCOUNTER — Encounter: Payer: Self-pay | Admitting: Internal Medicine

## 2017-08-20 VITALS — BP 110/76 | HR 89 | Resp 16 | Ht 68.0 in | Wt 185.0 lb

## 2017-08-20 DIAGNOSIS — F411 Generalized anxiety disorder: Secondary | ICD-10-CM

## 2017-08-20 DIAGNOSIS — E782 Mixed hyperlipidemia: Secondary | ICD-10-CM

## 2017-08-20 DIAGNOSIS — Z1231 Encounter for screening mammogram for malignant neoplasm of breast: Secondary | ICD-10-CM

## 2017-08-20 DIAGNOSIS — Z0000 Encounter for general adult medical examination without abnormal findings: Secondary | ICD-10-CM | POA: Diagnosis not present

## 2017-08-20 DIAGNOSIS — Z6828 Body mass index (BMI) 28.0-28.9, adult: Secondary | ICD-10-CM | POA: Diagnosis not present

## 2017-08-20 DIAGNOSIS — Z1239 Encounter for other screening for malignant neoplasm of breast: Secondary | ICD-10-CM

## 2017-08-20 LAB — POCT URINALYSIS DIPSTICK
BILIRUBIN UA: NEGATIVE
Glucose, UA: NEGATIVE
Ketones, UA: NEGATIVE
Leukocytes, UA: NEGATIVE
NITRITE UA: NEGATIVE
PH UA: 5 (ref 5.0–8.0)
PROTEIN UA: NEGATIVE
RBC UA: NEGATIVE
Spec Grav, UA: 1.01 (ref 1.010–1.025)
UROBILINOGEN UA: 0.2 U/dL

## 2017-08-20 MED ORDER — PHENTERMINE HCL 37.5 MG PO CAPS: 37.5000 mg | ORAL_CAPSULE | ORAL | 2 refills | Status: DC

## 2017-08-20 NOTE — Patient Instructions (Signed)

## 2017-08-20 NOTE — Progress Notes (Signed)
Date:  08/20/2017   Name:  Amy Joyce   DOB:  [birth year]   MRN:  [id number]   Chief Complaint: Annual Exam and Nutrition Counseling (Wants Phentermine refills ) Amy Joyce is a 52 y.o. female who presents today for her Complete Annual Exam. She feels fairly well. She reports exercising none. She reports she is sleeping poorly. Mammogram is due.  She denies breast complaints.  Pap was done in 2017 - due in 2020.  Colonoscopy was done in 2016 - due again in 10 years.  She is still trying to lose weight.  She is not exercising but plans to start back swimming when the pool opens.  She did well last year with phentermine - lost 15 lbs and has mostly kept is off.  She would like to have it again for 6 months to help her reach her goal of 140 lbs. Anxiety  Presents for follow-up visit. Patient reports no chest pain, dizziness, nervous/anxious behavior, palpitations or shortness of breath. Symptoms occur rarely. The severity of symptoms is mild.        Review of Systems  Constitutional: Negative for chills, fatigue and fever.  HENT: Negative for congestion, hearing loss, tinnitus, trouble swallowing and voice change.   Eyes: Negative for visual disturbance.  Respiratory: Negative for cough, chest tightness, shortness of breath and wheezing.   Cardiovascular: Negative for chest pain, palpitations and leg swelling.  Gastrointestinal: Negative for abdominal pain, constipation, diarrhea and vomiting.  Endocrine: Negative for polydipsia and polyuria.  Genitourinary: Negative for dysuria, frequency, genital sores, vaginal bleeding and vaginal discharge.  Musculoskeletal: Negative for arthralgias, gait problem and joint swelling.  Skin: Negative for color change and rash.  Neurological: Negative for dizziness, tremors, light-headedness and headaches.  Hematological: Negative for adenopathy. Does not bruise/bleed easily.  Psychiatric/Behavioral: Negative for dysphoric mood and sleep  disturbance. The patient is not nervous/anxious.     Patient Active Problem List   Diagnosis Date Noted  . ADD (attention deficit disorder) 07/25/2015  . Mixed hyperlipidemia 04/19/2015  . Weight gain 04/19/2015  . Bilateral hand pain 04/19/2015  . Generalized anxiety disorder 04/19/2015  . Pruritus 04/19/2015    Prior to Admission medications   Medication Sig Start Date End Date Taking? Authorizing Provider  Biotin w/ Vitamins C & E (HAIR/SKIN/NAILS PO) Take by mouth.    [provider]  buPROPion (WELLBUTRIN XL) 150 MG 24 hr tablet TAKE 3 TABLETS BY MOUTH  DAILY 01/10/17   Reubin Milan, MD  Calcium Citrate-Vitamin D (CALCIUM CITRATE + D3 PO) Take by mouth.    [provider]  magnesium oxide (MAG-OX) 400 MG tablet Take 500 mg by mouth 2 (two) times daily.     [provider]    Allergies  Allergen Reactions  . Oxycontin [Oxycodone Hcl] Itching    Past Surgical History:  Procedure Laterality Date  . APPENDECTOMY    . NASAL SEPTUM SURGERY    . TONSILLECTOMY AND ADENOIDECTOMY    . TUBAL LIGATION      Social History   Tobacco Use  . Smoking status: Former Smoker    Last attempt to quit: 04/22/2004    Years since quitting: 13.3  . Smokeless tobacco: Never Used  Substance Use Topics  . Alcohol use: Yes    Alcohol/week: 0.0 oz    Comment: occasional consumption  . Drug use: No     Medication list has been reviewed and updated.  PHQ 2/9 Scores 08/20/2017 08/20/2017 08/21/2016  PHQ -  2 Score 1 0 0  PHQ- 9 Score 7 - -    Physical Exam  Constitutional: She is oriented to person, place, and time. She appears well-developed and well-nourished. No distress.  HENT:  Head: Normocephalic and atraumatic.  Right Ear: Tympanic membrane and ear canal normal.  Left Ear: Tympanic membrane and ear canal normal.  Nose: Right sinus exhibits no maxillary sinus tenderness. Left sinus exhibits no maxillary sinus tenderness.  Mouth/Throat: Uvula is midline  and oropharynx is clear and moist.  Eyes: Conjunctivae and EOM are normal. Right eye exhibits no discharge. Left eye exhibits no discharge. No scleral icterus.  Neck: Normal range of motion. Carotid bruit is not present. No erythema present. No thyromegaly present.  Cardiovascular: Normal rate, regular rhythm, normal heart sounds and normal pulses.  Pulmonary/Chest: Effort normal. No respiratory distress. She has no wheezes. Right breast exhibits no mass, no nipple discharge, no skin change and no tenderness. Left breast exhibits no mass, no nipple discharge, no skin change and no tenderness.  Abdominal: Soft. Bowel sounds are normal. There is no hepatosplenomegaly. There is no tenderness. There is no CVA tenderness.  Musculoskeletal: Normal range of motion.  Lymphadenopathy:    She has no cervical adenopathy.    She has no axillary adenopathy.  Neurological: She is alert and oriented to person, place, and time. She has normal reflexes. No cranial nerve deficit or sensory deficit.  Skin: Skin is warm, dry and intact. No rash noted.  Psychiatric: She has a normal mood and affect. Her speech is normal and behavior is normal. Thought content normal.  Nursing note and vitals reviewed.   BP 110/76   Pulse 89   Resp 16   Ht  (1.727 m)   Wt 185 lb (83.9 kg)   SpO2 97%   BMI 28.13 kg/m   Assessment and Plan: 1. Annual physical exam - CBC with Differential/Platelet - Comprehensive metabolic panel - POCT urinalysis dipstick  2. Breast cancer screening - MM DIGITAL SCREENING BILATERAL; Future  3. Generalized anxiety disorder Doing well on bupropion - TSH  4. Mixed hyperlipidemia Check labs - Lipid panel  5. BMI 28.0-28.9,adult Follow up BP and weight check in 3 months to get next refills - phentermine 37.5 MG capsule; Take 1 capsule (37.5 mg total) by mouth every morning.  Dispense: 30 capsule; Refill: 2   Meds ordered this encounter  Medications  . phentermine 37.5 MG  capsule    Sig: Take 1 capsule (37.5 mg total) by mouth every morning.    Dispense:  30 capsule    Refill:  2    Partially dictated using Animal nutritionist. Any errors are unintentional.  Bari Edward, MD [facility]  08/20/2017

## 2017-08-21 LAB — CBC WITH DIFFERENTIAL/PLATELET
BASOS ABS: 0 10*3/uL (ref 0.0–0.2)
Basos: 1 %
EOS (ABSOLUTE): 0.2 10*3/uL (ref 0.0–0.4)
Eos: 3 %
Hematocrit: 40.7 % (ref 34.0–46.6)
Hemoglobin: 13.6 g/dL (ref 11.1–15.9)
IMMATURE GRANS (ABS): 0 10*3/uL (ref 0.0–0.1)
Immature Granulocytes: 0 %
LYMPHS: 25 %
Lymphocytes Absolute: 1.5 10*3/uL (ref 0.7–3.1)
MCH: 29.4 pg (ref 26.6–33.0)
MCHC: 33.4 g/dL (ref 31.5–35.7)
MCV: 88 fL (ref 79–97)
MONOS ABS: 0.4 10*3/uL (ref 0.1–0.9)
Monocytes: 6 %
NEUTROS ABS: 3.9 10*3/uL (ref 1.4–7.0)
NEUTROS PCT: 65 %
PLATELETS: 311 10*3/uL (ref 150–379)
RBC: 4.62 x10E6/uL (ref 3.77–5.28)
RDW: 13.5 % (ref 12.3–15.4)
WBC: 6 10*3/uL (ref 3.4–10.8)

## 2017-08-21 LAB — LIPID PANEL
CHOLESTEROL TOTAL: 204 mg/dL — AB (ref 100–199)
Chol/HDL Ratio: 3.8 ratio (ref 0.0–4.4)
HDL: 54 mg/dL (ref >39)
LDL Calculated: 117 mg/dL — ABNORMAL HIGH (ref 0–99)
TRIGLYCERIDES: 165 mg/dL — AB (ref 0–149)
VLDL CHOLESTEROL CAL: 33 mg/dL (ref 5–40)

## 2017-08-21 LAB — COMPREHENSIVE METABOLIC PANEL
ALK PHOS: 65 IU/L (ref 39–117)
ALT: 23 IU/L (ref 0–32)
AST: 21 IU/L (ref 0–40)
Albumin/Globulin Ratio: 2.1 (ref 1.2–2.2)
Albumin: 4.6 g/dL (ref 3.5–5.5)
BILIRUBIN TOTAL: 0.3 mg/dL (ref 0.0–1.2)
BUN/Creatinine Ratio: 17 (ref 9–23)
BUN: 19 mg/dL (ref 6–24)
CO2: 23 mmol/L (ref 20–29)
Calcium: 9.2 mg/dL (ref 8.7–10.2)
Chloride: 104 mmol/L (ref 96–106)
Creatinine, Ser: 1.09 mg/dL — ABNORMAL HIGH (ref 0.57–1.00)
GFR calc non Af Amer: 58 mL/min/{1.73_m2} — ABNORMAL LOW (ref >59)
GFR, EST AFRICAN AMERICAN: 67 mL/min/{1.73_m2} (ref >59)
GLUCOSE: 68 mg/dL (ref 65–99)
Globulin, Total: 2.2 g/dL (ref 1.5–4.5)
POTASSIUM: 4.5 mmol/L (ref 3.5–5.2)
Sodium: 143 mmol/L (ref 134–144)
TOTAL PROTEIN: 6.8 g/dL (ref 6.0–8.5)

## 2017-08-21 LAB — TSH: TSH: 3.28 u[IU]/mL (ref 0.450–4.500)

## 2017-09-08 ENCOUNTER — Other Ambulatory Visit: Payer: Self-pay | Admitting: Internal Medicine

## 2017-09-08 ENCOUNTER — Telehealth: Payer: Self-pay

## 2017-09-08 DIAGNOSIS — R635 Abnormal weight gain: Secondary | ICD-10-CM

## 2017-09-08 MED ORDER — PHENTERMINE HCL 37.5 MG PO TABS: 37.5000 mg | ORAL_TABLET | Freq: Every day | ORAL | 1 refills | Status: DC

## 2017-09-08 NOTE — Telephone Encounter (Signed)
Patient wants tablets not capsules of Phentermine.  I have cancelled refills on the capsules and Dr.Berglund sent new tablets.  Patient aware and said she has 11 capsules left.

## 2017-09-28 ENCOUNTER — Ambulatory Visit
Admission: EM | Admit: 2017-09-28 | Discharge: 2017-09-28 | Disposition: A | Discharge status: Home / Self Care | Payer: 59 | Attending: Family Medicine | Admitting: Family Medicine

## 2017-09-28 ENCOUNTER — Other Ambulatory Visit: Payer: Self-pay

## 2017-09-28 DIAGNOSIS — N12 Tubulo-interstitial nephritis, not specified as acute or chronic: Secondary | ICD-10-CM | POA: Diagnosis not present

## 2017-09-28 LAB — URINALYSIS, COMPLETE (UACMP) WITH MICROSCOPIC
Bilirubin Urine: NEGATIVE
GLUCOSE, UA: NEGATIVE mg/dL
Hgb urine dipstick: NEGATIVE
Ketones, ur: NEGATIVE mg/dL
Nitrite: POSITIVE — AB
PH: 8.5 — AB (ref 5.0–8.0)
Protein, ur: NEGATIVE mg/dL
RBC / HPF: NONE SEEN RBC/hpf (ref 0–5)
Specific Gravity, Urine: 1.015 (ref 1.005–1.030)

## 2017-09-28 MED ORDER — HYDROCODONE-ACETAMINOPHEN 5-325 MG PO TABS: 2.0000 | ORAL_TABLET | Freq: Three times a day (TID) | ORAL | 0 refills | Status: DC | PRN

## 2017-09-28 MED ORDER — SULFAMETHOXAZOLE-TRIMETHOPRIM 800-160 MG PO TABS: 1.0000 | ORAL_TABLET | Freq: Two times a day (BID) | ORAL | 0 refills | Status: DC

## 2017-09-28 NOTE — Discharge Instructions (Signed)
Antibiotic as prescribed.  Take care  Dr. Jiali Linney  

## 2017-09-28 NOTE — ED Provider Notes (Signed)
[facility]  CSN: [id number] Arrival date & time: 09/28/17  1001  History   Chief Complaint Chief Complaint  Patient presents with  . Back Pain   HPI  52 year old female presents with the above complaint.  Patient reports right-sided mid to lower back pain.  She states it is been going on for the past 2 weeks.  Worsened as of last night.  Pain is severe.  She is unsure of the inciting factor.  She states that it may be from her recent lifting of her dog.  Pain is currently 6/10 in severity.  Patient states that she has no urinary symptoms -dysuria, urgency, frequency.  She has had some nausea.  No vomiting.  No fever.  No chills.  No abdominal pain.  Patient is concerned given the location that she may have a kidney infection.  No relieving factors.  Pain seems to be worse with activity.  No other associated symptoms.  No other complaints.  Past Medical History:  Diagnosis Date  . Depression    Patient Active Problem List   Diagnosis Date Noted  . ADD (attention deficit disorder) 07/25/2015  . Mixed hyperlipidemia 04/19/2015  . Weight gain 04/19/2015  . Bilateral hand pain 04/19/2015  . Generalized anxiety disorder 04/19/2015  . Pruritus 04/19/2015   Past Surgical History:  Procedure Laterality Date  . APPENDECTOMY    . NASAL SEPTUM SURGERY    . TONSILLECTOMY AND ADENOIDECTOMY    . TUBAL LIGATION     OB History   None    Home Medications    Prior to Admission medications   Medication Sig Start Date End Date Taking? Authorizing Provider  Biotin w/ Vitamins C & E (HAIR/SKIN/NAILS PO) Take by mouth.    [provider]  buPROPion (WELLBUTRIN XL) 150 MG 24 hr tablet TAKE 3 TABLETS BY MOUTH  DAILY 01/10/17   Glean Hess, MD  Calcium Citrate-Vitamin D (CALCIUM CITRATE + D3 PO) Take by mouth.    [provider]  HYDROcodone-acetaminophen (NORCO/VICODIN) 5-325 MG tablet Take 2 tablets by mouth every 8 (eight) hours as needed. 09/28/17   Coral Spikes, DO  magnesium oxide (MAG-OX) 400 MG tablet Take 500 mg by mouth 2 (two) times daily.     [provider]  phentermine (ADIPEX-P) 37.5 MG tablet Take 1 tablet (37.5 mg total) by mouth daily before breakfast. 09/08/17   Glean Hess, MD  sulfamethoxazole-trimethoprim (BACTRIM DS,SEPTRA DS) 800-160 MG tablet Take 1 tablet by mouth 2 (two) times daily. 09/28/17   Coral Spikes, DO   Family History Family History  Problem Relation Age of Onset  . Leukemia Mother   . Multiple myeloma Father   . Diabetes Father   . Breast cancer Neg Hx    Social History Social History   Tobacco Use  . Smoking status: Former Smoker    Last attempt to quit: 04/22/2004    Years since quitting: 13.4  . Smokeless tobacco: Never Used  Substance Use Topics  . Alcohol use: Yes    Alcohol/week: 0.0 oz    Comment: occasional consumption  . Drug use: No   Allergies   Oxycontin [oxycodone hcl]  Review of Systems Review of Systems  Constitutional: Negative for chills and fever.  Gastrointestinal: Positive for nausea.  Genitourinary: Negative.   Musculoskeletal: Positive for back pain.   Physical Exam Triage Vital Signs ED Triage Vitals  Enc Vitals Group     BP 09/28/17 1017 106/76  Pulse Rate 09/28/17 1017 79     Resp 09/28/17 1017 18     Temp 09/28/17 1017 98 F (36.7 C)     Temp Source 09/28/17 1017 Oral     SpO2 09/28/17 1017 100 %     Weight 09/28/17 1018 178 lb (80.7 kg)     Height 09/28/17 1018 5' 8" (1.727 m)     Head Circumference --      Peak Flow --      Pain Score 09/28/17 1018 6     Pain Loc --      Pain Edu? --      Excl. in [place]? --    Updated Vital Signs BP 106/76 (BP Location: Left Arm)   Pulse 79   Temp 98 F (36.7 C) (Oral)   Resp 18   Ht 5' 8" (1.727 m)   Wt 178 lb (80.7 kg)   SpO2 100%   BMI 27.06 kg/m  Physical Exam  Constitutional: She is oriented to person, place, and time. She appears well-developed. No distress.  HENT:  Head: Normocephalic  and atraumatic.  Nose: Nose normal.  Cardiovascular: Normal rate and regular rhythm.  Pulmonary/Chest: Effort normal and breath sounds normal. She has no wheezes. She has no rales.  Abdominal: She exhibits no distension.  Musculoskeletal:  +CVA tenderness on the right.  Neurological: She is alert and oriented to person, place, and time.  Psychiatric: She has a normal mood and affect. Her behavior is normal.  Nursing note and vitals reviewed.  UC Treatments / Results  Labs (all labs ordered are listed, but only abnormal results are displayed) Labs Reviewed  URINALYSIS, COMPLETE (UACMP) WITH MICROSCOPIC - Abnormal; Notable for the following components:      Result Value   APPearance HAZY (*)    pH 8.5 (*)    Nitrite POSITIVE (*)    Leukocytes, UA MODERATE (*)    Bacteria, UA MANY (*)    All other components within normal limits  URINE CULTURE    EKG None  Radiology No results found.  Procedures Procedures (including critical care time)  Medications Ordered in UC Medications - No data to display  Initial Impression / Assessment and Plan / UC Course  I have reviewed the triage vital signs and the nursing notes.  Pertinent labs & imaging results that were available during my care of the patient were reviewed by me and considered in my medical decision making (see chart for details).    52 year old female presents with right thoracolumbar pain.  Positive CVA tenderness.  Urinalysis consistent with infection.  Patient's presentation is very atypical as she has no symptoms other than pain to reflect pyelonephritis.  Sending culture.  Placing on Bactrim.  Vicodin for pain.  Final Clinical Impressions(s) / UC Diagnoses   Final diagnoses:  Pyelonephritis     Discharge Instructions     Antibiotic as prescribed.  Take care  Dr. Lacinda Axon    ED Prescriptions    Medication Sig Dispense Auth. Provider   sulfamethoxazole-trimethoprim (BACTRIM DS,SEPTRA DS) 800-160 MG tablet  Take 1 tablet by mouth 2 (two) times daily. 20 tablet Cook, Jayce G, DO   HYDROcodone-acetaminophen (NORCO/VICODIN) 5-325 MG tablet Take 2 tablets by mouth every 8 (eight) hours as needed. 10 tablet Coral Spikes, DO     Controlled Substance Prescriptions [place] Controlled Substance Registry consulted? Yes, I have consulted the [place] Controlled Substances Registry for this patient, and feel the risk/benefit ratio today is favorable for proceeding  with this prescription for a controlled substance.   Coral Spikes, [place] 09/28/17 1109

## 2017-09-28 NOTE — ED Triage Notes (Signed)
Pt with right sided back pain. Unsure if it's a pulled muscle or flank pain from a "kidney infection." Pain 6/10. Denies urinary sx. Having some nausea.

## 2017-09-30 ENCOUNTER — Telehealth: Payer: Self-pay

## 2017-09-30 LAB — URINE CULTURE

## 2017-09-30 NOTE — Telephone Encounter (Signed)
Patient was advise. She did not sound please as she is in a lot of pain. She state"Thank you" as she will try the AZO. Advise if sx doesn't get better after abx or OTC meds then she will need to come in to be reevaluated.

## 2017-09-30 NOTE — Telephone Encounter (Signed)
She had a significant infection but the antibiotic she was given should be able to treat it.  It may take the full course of antibiotics to have relief of symptoms.  She should drink as much fluids as possible to help flush out the infection.  The hydrocodone may be constipating and I advise stopping it as soon as possible.

## 2017-09-30 NOTE — Telephone Encounter (Signed)
She can take over the counter AZO which acts as a urinary tract anesthetic.  I do not prescribe narcotics for urinary tract infections.

## 2017-09-30 NOTE — Telephone Encounter (Signed)
Patient only has one Hydrocodone left and is requesting something else to manage her pain. Patient aware of hydrocodone causing constipation and will finish up abx until sx subside.

## 2017-09-30 NOTE — Telephone Encounter (Signed)
Patient called wanting to know how long it will take she will take to feel better after being seen in ED 09/28/2017. Patient is taking Hydrocodone for pain and Bactrim for her sx. She is having bloating as well. Please advise

## 2017-10-03 ENCOUNTER — Telehealth (HOSPITAL_COMMUNITY): Payer: Self-pay

## 2017-10-03 NOTE — Telephone Encounter (Signed)
Urine culture was positive for Staphylococcus and was given Bactrim at urgent care visit. Attempted to reach patient. No answer at this time.

## 2017-12-02 ENCOUNTER — Other Ambulatory Visit: Payer: Self-pay | Admitting: Internal Medicine

## 2017-12-03 ENCOUNTER — Ambulatory Visit: Payer: 59 | Admitting: Internal Medicine

## 2017-12-08 ENCOUNTER — Other Ambulatory Visit: Payer: Self-pay

## 2017-12-08 MED ORDER — BUPROPION HCL ER (XL) 150 MG PO TB24: 450.0000 mg | ORAL_TABLET | Freq: Every day | ORAL | 0 refills | Status: DC

## 2018-02-01 ENCOUNTER — Other Ambulatory Visit: Payer: Self-pay | Admitting: Internal Medicine

## 2018-02-17 ENCOUNTER — Other Ambulatory Visit: Payer: Self-pay | Admitting: Internal Medicine

## 2018-06-02 ENCOUNTER — Emergency Department (HOSPITAL_COMMUNITY): Payer: BLUE CROSS/BLUE SHIELD

## 2018-06-02 ENCOUNTER — Encounter (HOSPITAL_COMMUNITY): Payer: Self-pay

## 2018-06-02 DIAGNOSIS — Y929 Unspecified place or not applicable: Secondary | ICD-10-CM | POA: Insufficient documentation

## 2018-06-02 DIAGNOSIS — Y9301 Activity, walking, marching and hiking: Secondary | ICD-10-CM | POA: Diagnosis not present

## 2018-06-02 DIAGNOSIS — X58XXXA Exposure to other specified factors, initial encounter: Secondary | ICD-10-CM | POA: Diagnosis not present

## 2018-06-02 DIAGNOSIS — Y999 Unspecified external cause status: Secondary | ICD-10-CM | POA: Insufficient documentation

## 2018-06-02 DIAGNOSIS — Z87891 Personal history of nicotine dependence: Secondary | ICD-10-CM | POA: Insufficient documentation

## 2018-06-02 DIAGNOSIS — Z79899 Other long term (current) drug therapy: Secondary | ICD-10-CM | POA: Insufficient documentation

## 2018-06-02 DIAGNOSIS — S99911A Unspecified injury of right ankle, initial encounter: Secondary | ICD-10-CM | POA: Diagnosis not present

## 2018-06-02 DIAGNOSIS — S93401A Sprain of unspecified ligament of right ankle, initial encounter: Secondary | ICD-10-CM | POA: Diagnosis not present

## 2018-06-02 NOTE — ED Triage Notes (Signed)
Pt arrived with complaints of right ankle pain after a fall today. Unable to bare weight at this time. Some swelling noted.

## 2018-06-03 ENCOUNTER — Emergency Department (HOSPITAL_COMMUNITY)
Admission: EM | Admit: 2018-06-03 | Discharge: 2018-06-03 | Disposition: A | Discharge status: Home / Self Care | Payer: BLUE CROSS/BLUE SHIELD | Attending: Emergency Medicine | Admitting: Emergency Medicine

## 2018-06-03 DIAGNOSIS — S93401A Sprain of unspecified ligament of right ankle, initial encounter: Secondary | ICD-10-CM

## 2018-06-03 MED ORDER — HYDROCODONE-ACETAMINOPHEN 5-325 MG PO TABS: 1.0000 | ORAL_TABLET | Freq: Four times a day (QID) | ORAL | 0 refills | Status: DC | PRN

## 2018-06-03 MED ORDER — IBUPROFEN 600 MG PO TABS: 600.0000 mg | ORAL_TABLET | Freq: Four times a day (QID) | ORAL | 0 refills | Status: DC | PRN

## 2018-06-03 MED ORDER — IBUPROFEN 200 MG PO TABS
600.0000 mg | ORAL_TABLET | Freq: Once | ORAL | Status: AC
Start: 2018-06-03 — End: 2018-06-03
  Administered 2018-06-03: 600 mg via ORAL
  Filled 2018-06-03: qty 3

## 2018-06-03 NOTE — ED Provider Notes (Signed)
[facility] DEPT Provider Note   CSN: [id number] Arrival date & time: 06/02/18  2031     History   Chief Complaint Chief Complaint  Patient presents with  . Fall    HPI Amy Joyce is a 53 y.o. female.  Patient to ED with right ankle pain and swelling since around 7:30 when she inverted the ankle while walking. No other injury. Pain is laterally located. No deformity or wound/bleeding.   The history is provided by the patient. No language interpreter was used.    Past Medical History:  Diagnosis Date  . Depression     Patient Active Problem List   Diagnosis Date Noted  . ADD (attention deficit disorder) 07/25/2015  . Mixed hyperlipidemia 04/19/2015  . Weight gain 04/19/2015  . Bilateral hand pain 04/19/2015  . Generalized anxiety disorder 04/19/2015  . Pruritus 04/19/2015    Past Surgical History:  Procedure Laterality Date  . APPENDECTOMY    . NASAL SEPTUM SURGERY    . TONSILLECTOMY AND ADENOIDECTOMY    . TUBAL LIGATION       OB History   No obstetric history on file.      Home Medications    Prior to Admission medications   Medication Sig Start Date End Date Taking? Authorizing Provider  Biotin w/ Vitamins C & E (HAIR/SKIN/NAILS PO) Take by mouth.    [provider]  buPROPion (WELLBUTRIN XL) 150 MG 24 hr tablet Take 3 tablets (450 mg total) by mouth daily. 12/08/17   Glean Hess, MD  Calcium Citrate-Vitamin D (CALCIUM CITRATE + D3 PO) Take by mouth.    [provider]  HYDROcodone-acetaminophen (NORCO/VICODIN) 5-325 MG tablet Take 2 tablets by mouth every 8 (eight) hours as needed. 09/28/17   Coral Spikes, DO  magnesium oxide (MAG-OX) 400 MG tablet Take 500 mg by mouth 2 (two) times daily.     [provider]  phentermine (ADIPEX-P) 37.5 MG tablet Take 1 tablet (37.5 mg total) by mouth daily before breakfast. 09/08/17   Glean Hess, MD  sulfamethoxazole-trimethoprim (BACTRIM  DS,SEPTRA DS) 800-160 MG tablet Take 1 tablet by mouth 2 (two) times daily. 09/28/17   Coral Spikes, DO    Family History Family History  Problem Relation Age of Onset  . Leukemia Mother   . Multiple myeloma Father   . Diabetes Father   . Breast cancer Neg Hx     Social History Social History   Tobacco Use  . Smoking status: Former Smoker    Last attempt to quit: 04/22/2004    Years since quitting: 14.1  . Smokeless tobacco: Never Used  Substance Use Topics  . Alcohol use: Yes    Alcohol/week: 0.0 standard drinks    Comment: occasional consumption  . Drug use: No     Allergies   Oxycontin [oxycodone hcl]   Review of Systems Review of Systems  Gastrointestinal: Negative.  Negative for nausea.  Musculoskeletal:       See HPI.  Skin: Negative.  Negative for color change and wound.  Neurological: Negative.  Negative for numbness.     Physical Exam Updated Vital Signs BP 113/73 (BP Location: Left Arm)   Pulse 83   Temp 98.8 F (37.1 C) (Oral)   Resp 14   Ht '5\' 7"'$  (1.702 m)   Wt 81.6 kg   SpO2 100%   BMI 28.19 kg/m   Physical Exam Vitals signs and nursing note reviewed.  Constitutional:  Appearance: She is well-developed.  Neck:     Musculoskeletal: Normal range of motion.  Pulmonary:     Effort: Pulmonary effort is normal.  Musculoskeletal:     Comments: Right ankle moderately swollen laterally without significant discoloration. No deformity. Achilles intact. Intact ROM digits distally.   Skin:    General: Skin is warm and dry.  Neurological:     Mental Status: She is alert and oriented to person, place, and time.      ED Treatments / Results  Labs (all labs ordered are listed, but only abnormal results are displayed) Labs Reviewed - No data to display  EKG None  Radiology Dg Ankle Complete Right  Result Date: 06/02/2018 CLINICAL DATA:  Twisted right ankle. EXAM: RIGHT ANKLE - COMPLETE 3+ VIEW COMPARISON:  None. FINDINGS: There is no  evidence of fracture, dislocation, or joint effusion. Chronic corticated bony densities are identified distal to the medial and lateral malleolus. Soft tissues are unremarkable. IMPRESSION: No acute fracture or dislocation is identified. Electronically Signed   By: Abelardo Diesel M.D.   On: 06/02/2018 21:20    Procedures Procedures (including critical care time)  Medications Ordered in ED Medications  ibuprofen (ADVIL,MOTRIN) tablet 600 mg (has no administration in time range)     Initial Impression / Assessment and Plan / ED Course  I have reviewed the triage vital signs and the nursing notes.  Pertinent labs & imaging results that were available during my care of the patient were reviewed by me and considered in my medical decision making (see chart for details).     Patient to ED with right ankle inversion injury. Imaging is negative. Joint without laxity - doubt ligamentous injury.   ASO/Crutches provided. Patient to drive home outside [employer]. Ibuprofen provided. Rx ibuprofen Norco #8. Follow up ortho prn.   Final Clinical Impressions(s) / ED Diagnoses   Final diagnoses:  None   1. Right ankle sprain   ED Discharge Orders    None       Charlann Lange, Hershal Coria 06/03/18 0231    Fatima Blank, MD 06/03/18 [phone]

## 2018-06-03 NOTE — Discharge Instructions (Addendum)
Use the crutches to be weight bearing as tolerated. Ice and elevate to reduce swelling. Take medications as prescribed. If pain is no better in 3-4 days, follow up with orthopedics for recheck and any further needed evaluation.

## 2018-06-03 NOTE — ED Notes (Signed)
Pt awaiting treatment rm V/S updated Pt states "ankle pain 11/10"

## 2018-06-05 DIAGNOSIS — S93401A Sprain of unspecified ligament of right ankle, initial encounter: Secondary | ICD-10-CM | POA: Diagnosis not present

## 2018-06-17 DIAGNOSIS — M25571 Pain in right ankle and joints of right foot: Secondary | ICD-10-CM | POA: Diagnosis not present

## 2018-06-17 DIAGNOSIS — S93491D Sprain of other ligament of right ankle, subsequent encounter: Secondary | ICD-10-CM | POA: Diagnosis not present

## 2018-06-27 IMAGING — MG MM DIGITAL SCREENING BILAT W/ CAD
4 series · 4 of 4 positions shown · non-contrast
Comparison: Previous exam(s).

CLINICAL DATA: Screening.

EXAM:
DIGITAL SCREENING BILATERAL MAMMOGRAM WITH CAD

[R CC]
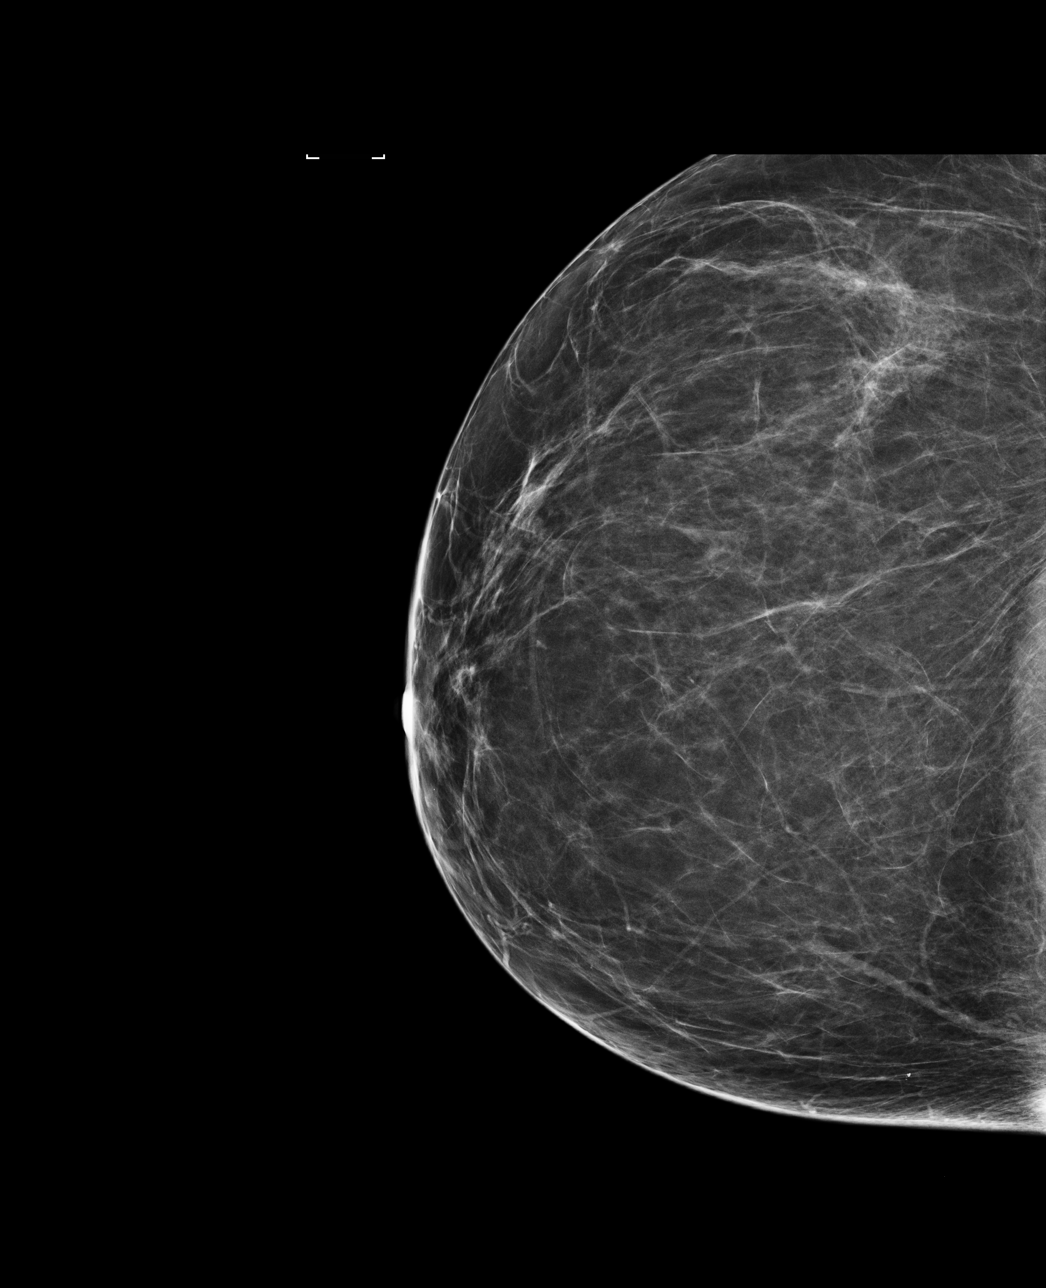

[L CC]
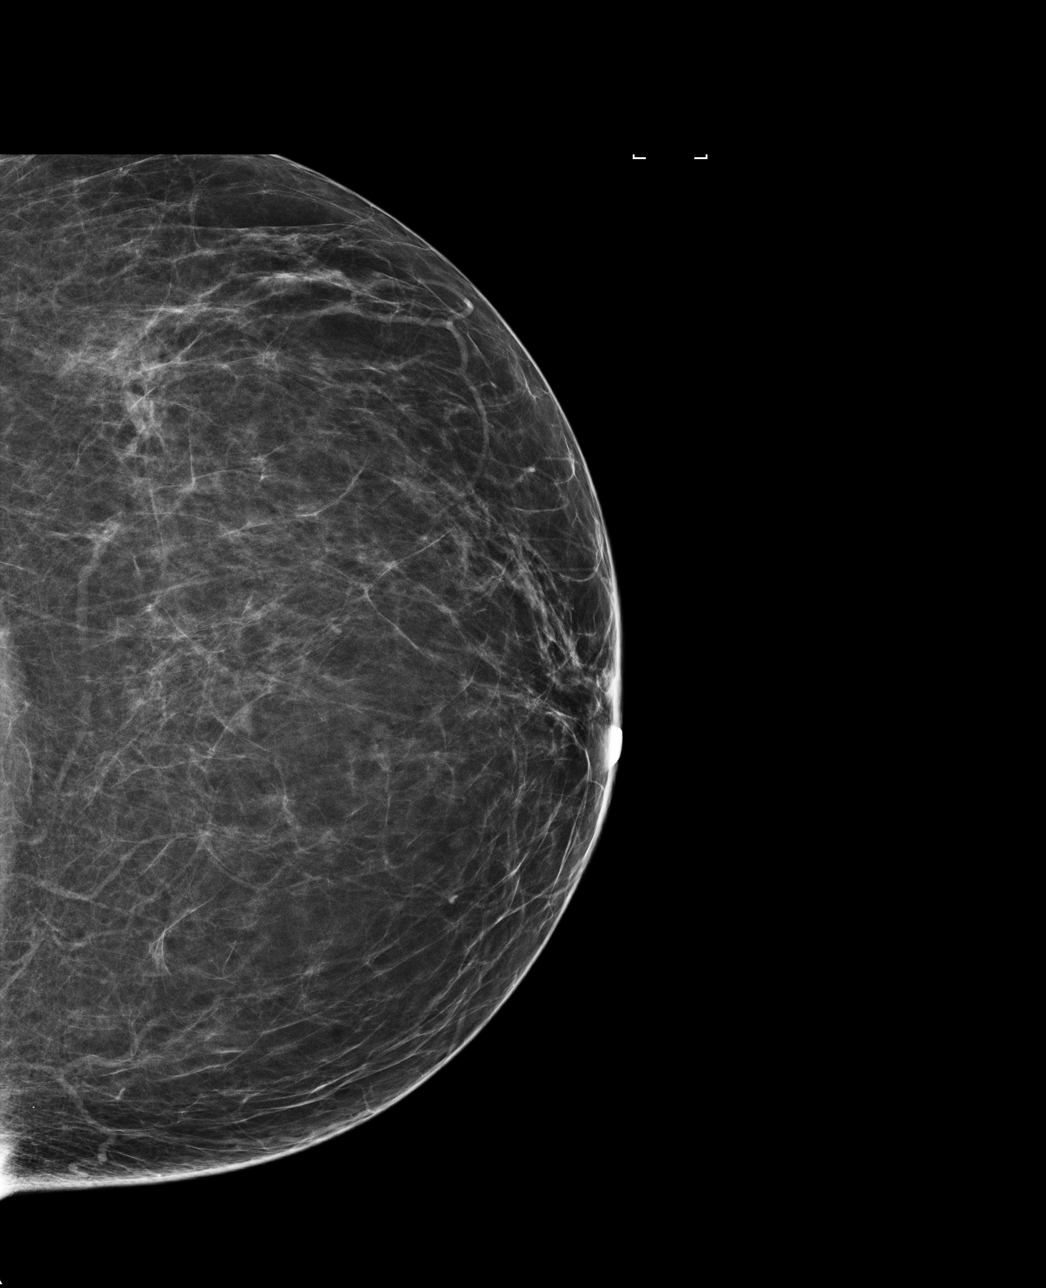

[L MLO]
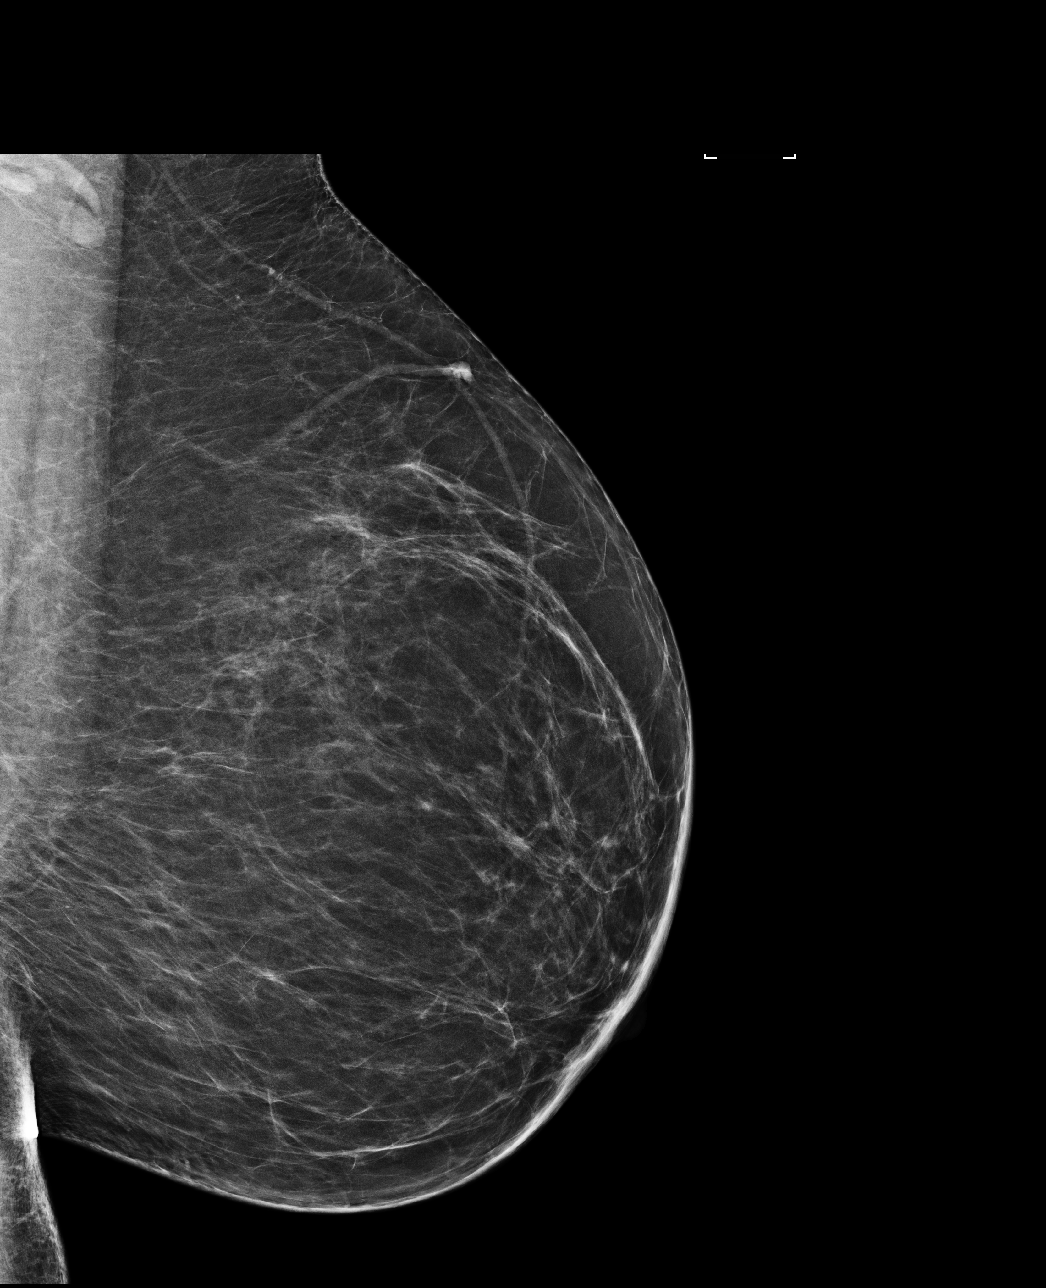

[R MLO]
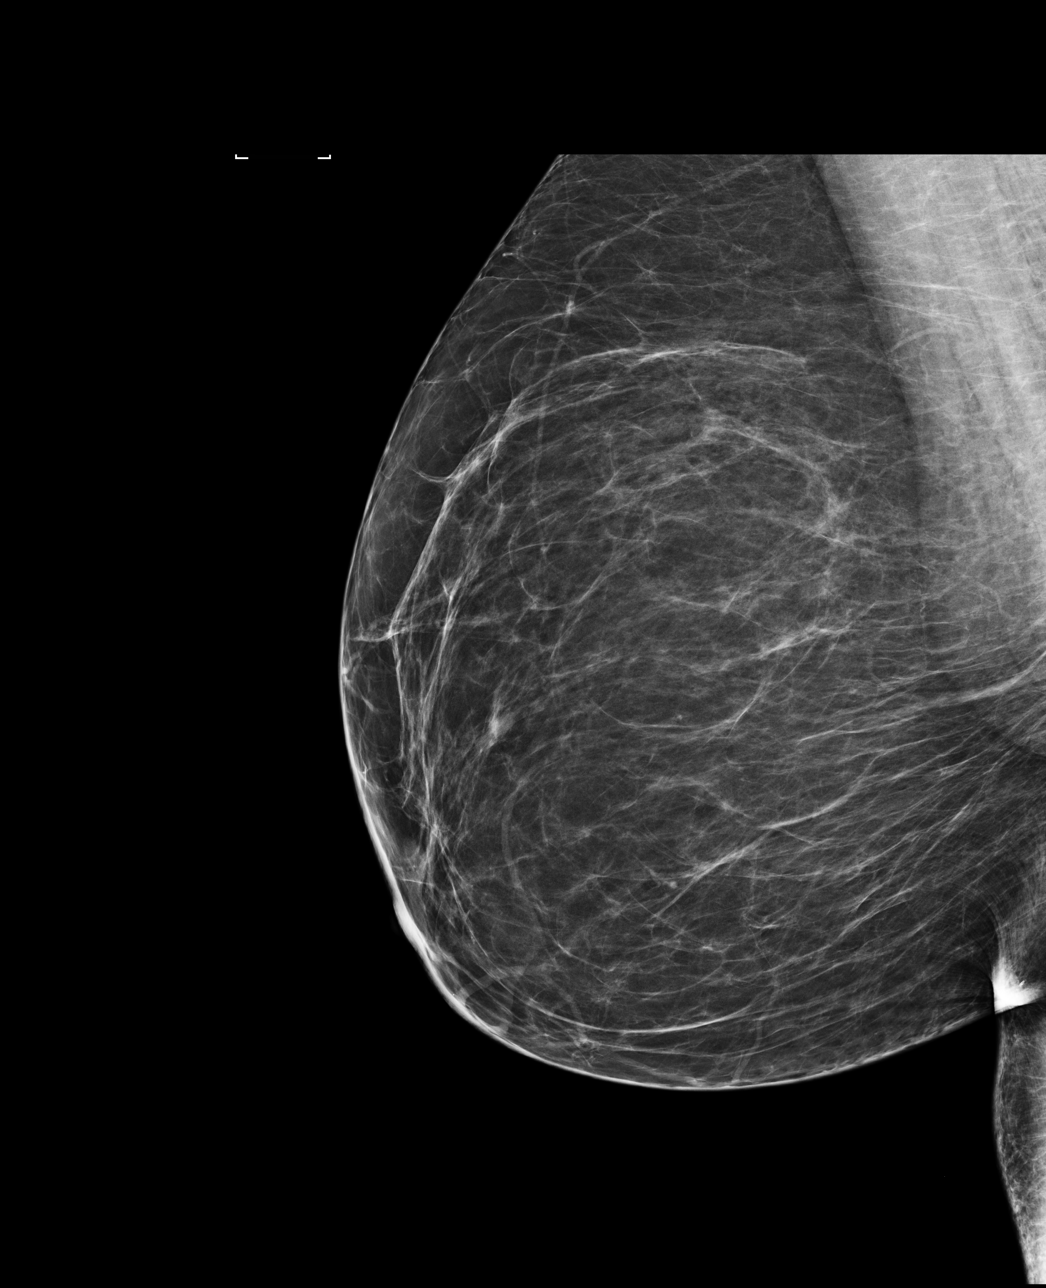

[4 of 4 positions shown; findings below may reference images not displayed]

ACR Breast Density Category b: There are scattered areas of
fibroglandular density.
FINDINGS: There are no findings suspicious for malignancy. Images were
processed with CAD.
IMPRESSION: No mammographic evidence of malignancy. A result letter of this
screening mammogram will be mailed directly to the patient.

RECOMMENDATION:
Screening mammogram in one year. (Code:AS-G-LCT)

BI-RADS CATEGORY  1: Negative.

## 2018-08-19 DIAGNOSIS — Z6830 Body mass index (BMI) 30.0-30.9, adult: Secondary | ICD-10-CM | POA: Diagnosis not present

## 2018-08-19 DIAGNOSIS — E663 Overweight: Secondary | ICD-10-CM | POA: Diagnosis not present

## 2018-08-25 ENCOUNTER — Encounter: Payer: 59 | Admitting: Internal Medicine

## 2018-09-17 DIAGNOSIS — Z6829 Body mass index (BMI) 29.0-29.9, adult: Secondary | ICD-10-CM | POA: Diagnosis not present

## 2018-09-17 DIAGNOSIS — E663 Overweight: Secondary | ICD-10-CM | POA: Diagnosis not present

## 2018-10-30 DIAGNOSIS — Z6829 Body mass index (BMI) 29.0-29.9, adult: Secondary | ICD-10-CM | POA: Diagnosis not present

## 2018-10-30 DIAGNOSIS — Z124 Encounter for screening for malignant neoplasm of cervix: Secondary | ICD-10-CM | POA: Diagnosis not present

## 2018-10-30 DIAGNOSIS — Z01419 Encounter for gynecological examination (general) (routine) without abnormal findings: Secondary | ICD-10-CM | POA: Diagnosis not present

## 2018-10-30 DIAGNOSIS — Z1151 Encounter for screening for human papillomavirus (HPV): Secondary | ICD-10-CM | POA: Diagnosis not present

## 2018-12-14 DIAGNOSIS — Z1231 Encounter for screening mammogram for malignant neoplasm of breast: Secondary | ICD-10-CM | POA: Diagnosis not present

## 2020-02-25 IMAGING — CR DG ANKLE COMPLETE 3+V*R*
3 series · 3 of 3 positions shown · non-contrast
Comparison: None.

CLINICAL DATA: Twisted right ankle.

EXAM:
RIGHT ANKLE - COMPLETE 3+ VIEW

[x ankle ap right]
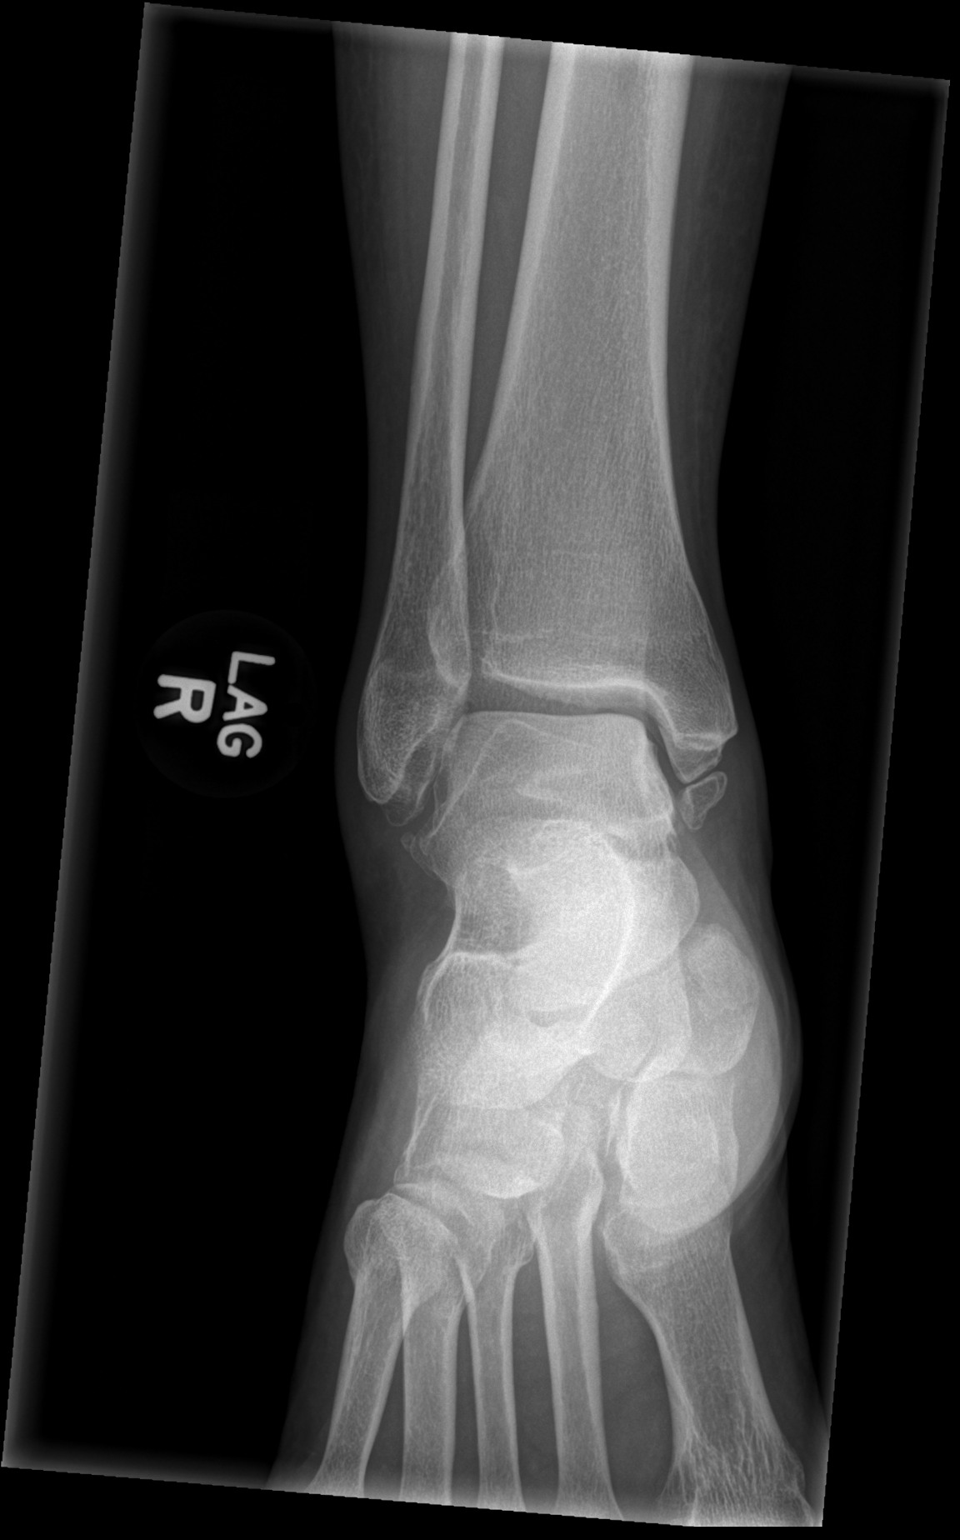

[x ankle lat right (1 of 2)]
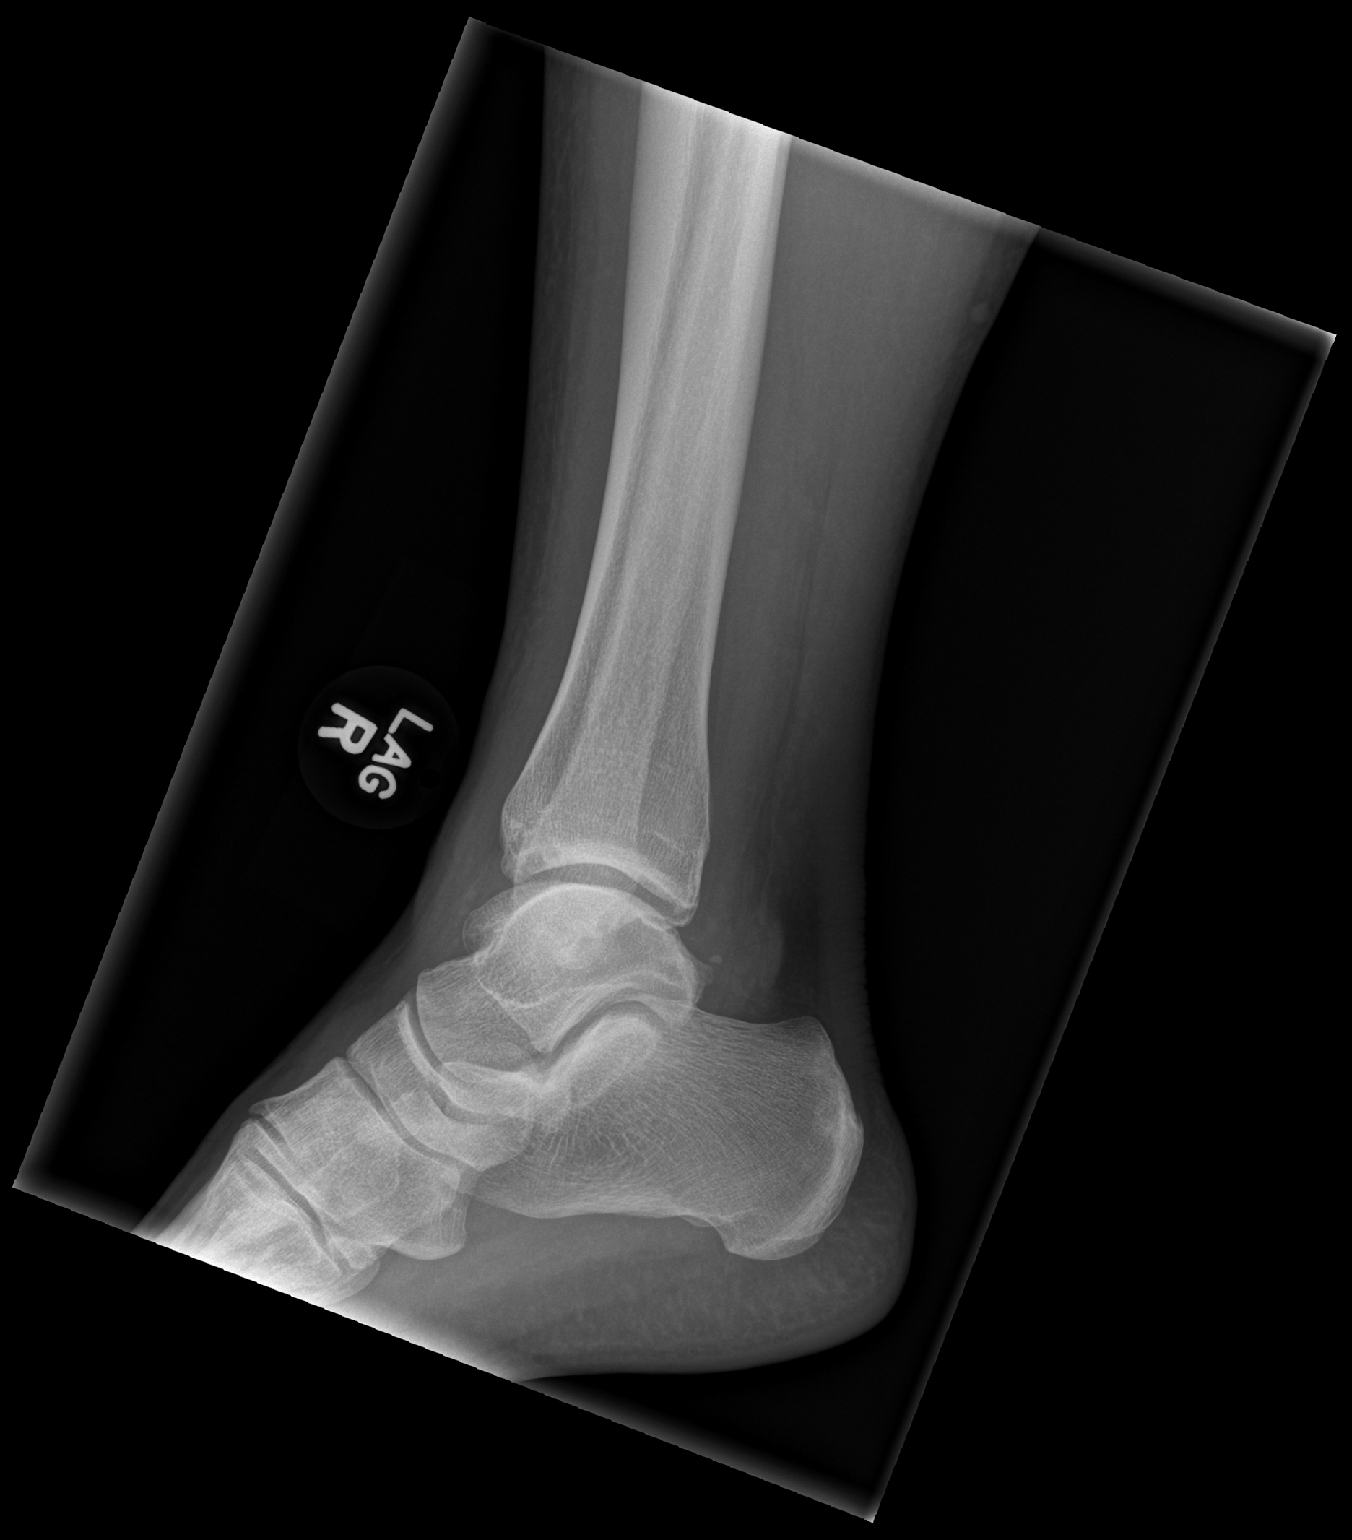

[x ankle lat right (2 of 2)]
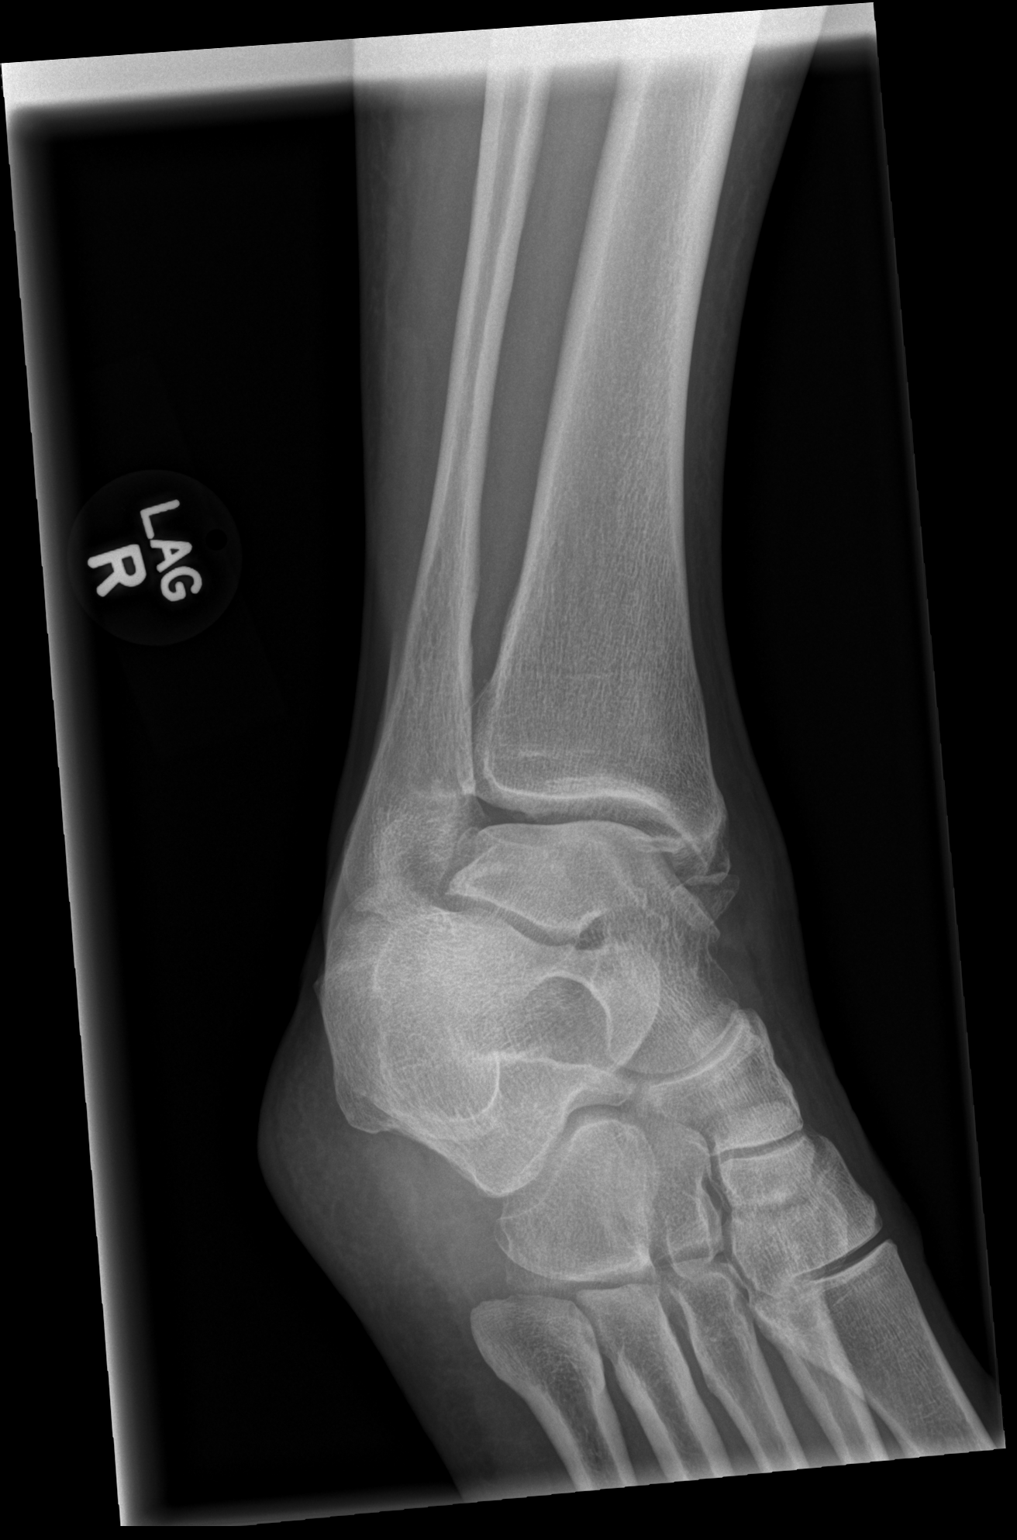

[3 of 3 positions shown; findings below may reference images not displayed]

FINDINGS: There is no evidence of fracture, dislocation, or joint effusion.
Chronic corticated bony densities are identified distal to the
medial and lateral malleolus. Soft tissues are unremarkable.
IMPRESSION: No acute fracture or dislocation is identified.

## 2020-05-15 ENCOUNTER — Other Ambulatory Visit: Payer: Self-pay

## 2020-05-15 ENCOUNTER — Ambulatory Visit (INDEPENDENT_AMBULATORY_CARE_PROVIDER_SITE_OTHER): Payer: BC Managed Care – PPO

## 2020-05-15 ENCOUNTER — Ambulatory Visit
Admission: EM | Admit: 2020-05-15 | Discharge: 2020-05-15 | Disposition: A | Discharge status: Home / Self Care | Payer: BC Managed Care – PPO | Attending: Family Medicine | Admitting: Family Medicine

## 2020-05-15 DIAGNOSIS — U071 COVID-19: Secondary | ICD-10-CM | POA: Insufficient documentation

## 2020-05-15 DIAGNOSIS — R059 Cough, unspecified: Secondary | ICD-10-CM | POA: Diagnosis not present

## 2020-05-15 DIAGNOSIS — J189 Pneumonia, unspecified organism: Secondary | ICD-10-CM | POA: Insufficient documentation

## 2020-05-15 HISTORY — DX: Obesity, unspecified: E66.9

## 2020-05-15 MED ORDER — PROMETHAZINE-DM 6.25-15 MG/5ML PO SYRP: 5.0000 mL | ORAL_SOLUTION | Freq: Four times a day (QID) | ORAL | 0 refills | Status: AC | PRN

## 2020-05-15 MED ORDER — AZITHROMYCIN 250 MG PO TABS: ORAL_TABLET | ORAL | 0 refills | Status: AC

## 2020-05-15 MED ORDER — DEXAMETHASONE 6 MG PO TABS: 6.0000 mg | ORAL_TABLET | Freq: Every day | ORAL | 0 refills | Status: AC

## 2020-05-15 MED ORDER — ALBUTEROL SULFATE HFA 108 (90 BASE) MCG/ACT IN AERS: 1.0000 | INHALATION_SPRAY | Freq: Four times a day (QID) | RESPIRATORY_TRACT | 0 refills | Status: AC | PRN

## 2020-05-15 MED ORDER — AMOXICILLIN-POT CLAVULANATE 875-125 MG PO TABS: 1.0000 | ORAL_TABLET | Freq: Two times a day (BID) | ORAL | 0 refills | Status: DC

## 2020-05-15 NOTE — Discharge Instructions (Signed)
Medication as prescribed.  Check my chart for COVID test results.  If you worsen, go to the ER.  Take care  Dr. Adriana Simas

## 2020-05-15 NOTE — ED Triage Notes (Addendum)
Pt is here with a fever and cough that started 10 days ago, pt has taken OTC meds to relieve discomfort. Pt states she is having chest soreness dure to her constant  coughing. Pt also states she & her husband both took at home COVID test & they both were POSITIVE on Tuesday.

## 2020-05-16 LAB — SARS CORONAVIRUS 2 (TAT 6-24 HRS): SARS Coronavirus 2: POSITIVE — AB

## 2020-05-16 NOTE — ED Provider Notes (Signed)
[facility]    CSN: [id number] Arrival date & time: 05/15/20  1901      History   Chief Complaint Chief Complaint  Patient presents with  . Fever  . Cough  . chest soreness   HPI  55 year old female presents with the above complaints.  Patient has been sick for the past 10 days.  She has taken a home Covid test which was positive.  Her husband is also sick.  She reports ongoing sore throat, chest discomfort, cough, fever, ear pain.  She feels very poorly.  She is particular bothered by the fever and constant coughing.  No reported shortness of breath.  Currently febrile at 100.6.  She states that her temperature was 103 earlier today.  No relieving factors.  No other complaints or concerns at this time.  Past Medical History:  Diagnosis Date  . Depression   . Obesity     Patient Active Problem List   Diagnosis Date Noted  . ADD (attention deficit disorder) 07/25/2015  . Mixed hyperlipidemia 04/19/2015  . Weight gain 04/19/2015  . Bilateral hand pain 04/19/2015  . Generalized anxiety disorder 04/19/2015  . Pruritus 04/19/2015    Past Surgical History:  Procedure Laterality Date  . APPENDECTOMY    . NASAL SEPTUM SURGERY    . TONSILLECTOMY AND ADENOIDECTOMY    . TUBAL LIGATION      OB History   No obstetric history on file.      Home Medications    Prior to Admission medications   Medication Sig Start Date End Date Taking? Authorizing Provider  albuterol (VENTOLIN HFA) 108 (90 Base) MCG/ACT inhaler Inhale 1-2 puffs into the lungs every 6 (six) hours as needed for wheezing or shortness of breath. 05/15/20  Yes Cortlyn Cannell G, DO  amoxicillin-clavulanate (AUGMENTIN) 875-125 MG tablet Take 1 tablet by mouth 2 (two) times daily. 05/15/20  Yes Lorriane Dehart G, DO  azithromycin (ZITHROMAX) 250 MG tablet 2 tablets on day 1, then 1 tablet daily on days 2-5. 05/15/20  Yes Daaiel Starlin G, DO  dexamethasone (DECADRON) 6 MG tablet Take 1 tablet (6 mg total) by mouth  daily. 05/15/20  Yes Brithney Bensen G, DO  promethazine-dextromethorphan (PROMETHAZINE-DM) 6.25-15 MG/5ML syrup Take 5 mLs by mouth 4 (four) times daily as needed for cough. 05/15/20  Yes Delmont Prosch G, DO  Biotin w/ Vitamins C & E (HAIR/SKIN/NAILS PO) Take by mouth.    [provider]  buPROPion (WELLBUTRIN XL) 150 MG 24 hr tablet Take 3 tablets (450 mg total) by mouth daily. 12/08/17 05/15/20  Glean Hess, MD  Calcium Citrate-Vitamin D (CALCIUM CITRATE + D3 PO) Take by mouth.  05/15/20  [provider]  phentermine (ADIPEX-P) 37.5 MG tablet Take 1 tablet (37.5 mg total) by mouth daily before breakfast. 09/08/17 05/15/20  Glean Hess, MD    Family History Family History  Problem Relation Age of Onset  . Leukemia Mother   . Multiple myeloma Father   . Diabetes Father   . Breast cancer Neg Hx     Social History Social History   Tobacco Use  . Smoking status: Former Smoker    Quit date: 04/22/2004    Years since quitting: 16.0  . Smokeless tobacco: Never Used  Vaping Use  . Vaping Use: Never used  Substance Use Topics  . Alcohol use: Yes  . Drug use: No     Allergies   Oxycodone and Oxycontin [oxycodone hcl]   Review of Systems  Review of Systems Per HPI  Physical Exam Triage Vital Signs ED Triage Vitals  Enc Vitals Group     BP 05/15/20 1925 104/87     Pulse Rate 05/15/20 1925 93     Resp 05/15/20 1925 19     Temp 05/15/20 1925 (!) 100.6 F (38.1 C)     Temp Source 05/15/20 1925 Oral     SpO2 05/15/20 1925 96 %     Weight --      Height --      Head Circumference --      Peak Flow --      Pain Score 05/15/20 1923 0     Pain Loc --      Pain Edu? --      Excl. in [place]? --    Updated Vital Signs BP 104/87 (BP Location: Left Arm)   Pulse 93   Temp (!) 100.6 F (38.1 C) (Oral)   Resp 19   SpO2 96%   Visual Acuity Right Eye Distance:   Left Eye Distance:   Bilateral Distance:    Right Eye Near:   Left Eye Near:    Bilateral Near:      Physical Exam Constitutional:      General: She is not in acute distress.    Comments: Appears mildly ill.  HENT:     Head: Normocephalic and atraumatic.     Mouth/Throat:     Pharynx: Oropharynx is clear. No oropharyngeal exudate.  Eyes:     General:        Right eye: No discharge.        Left eye: No discharge.     Conjunctiva/sclera: Conjunctivae normal.  Cardiovascular:     Rate and Rhythm: Normal rate and regular rhythm.  Pulmonary:     Effort: Pulmonary effort is normal. No respiratory distress.     Breath sounds: No wheezing or rales.  Neurological:     Mental Status: She is alert.  Psychiatric:        Mood and Affect: Mood normal.        Behavior: Behavior normal.    UC Treatments / Results  Labs (all labs ordered are listed, but only abnormal results are displayed) Labs Reviewed  SARS CORONAVIRUS 2 (TAT 6-24 HRS)    EKG   Radiology DG Chest 2 View  Result Date: 05/15/2020 CLINICAL DATA:  Cough EXAM: CHEST - 2 VIEW COMPARISON:  None. FINDINGS: Scattered foci of vague airspace disease consistent with pneumonia. No pleural effusion. Normal heart size. No pneumothorax IMPRESSION: Scattered foci of vague airspace disease consistent with pneumonia. Electronically Signed   By: Donavan Foil M.D.   On: 05/15/2020 19:46    Procedures Procedures (including critical care time)  Medications Ordered in UC Medications - No data to display  Initial Impression / Assessment and Plan / UC Course  I have reviewed the triage vital signs and the nursing notes.  Pertinent labs & imaging results that were available during my care of the patient were reviewed by me and considered in my medical decision making (see chart for details).    55 year old female presents with COVID-19.  Patient short of breath after going for chest x-ray.  She maintained her oxygen saturation around 93 to 94% on room air.  Chest x-ray performed today and was independently reviewed by me.   Interpretation: Scattered areas of pneumonia.  Given persistent fever and symptoms, I am covering her for superimposed bacterial pneumonia with Augmentin  and azithromycin.  I am placing her on Decadron as well.  Albuterol as needed.  Promethazine DM for cough.  Advised her to go to the hospital if she worsens.  Referral was placed to infusion clinic.  Final Clinical Impressions(s) / UC Diagnoses   Final diagnoses:  COVID-19  Pneumonia of both lungs due to infectious organism, unspecified part of lung     Discharge Instructions     Medication as prescribed.  Check my chart for COVID test results.  If you worsen, go to the ER.  Take care  Dr. Lacinda Axon     ED Prescriptions    Medication Sig Dispense Auth. Provider   dexamethasone (DECADRON) 6 MG tablet Take 1 tablet (6 mg total) by mouth daily. 10 tablet Ndrew Creason G, DO   amoxicillin-clavulanate (AUGMENTIN) 875-125 MG tablet Take 1 tablet by mouth 2 (two) times daily. 20 tablet Solene Hereford G, DO   azithromycin (ZITHROMAX) 250 MG tablet 2 tablets on day 1, then 1 tablet daily on days 2-5. 6 tablet Magdalen Cabana G, DO   promethazine-dextromethorphan (PROMETHAZINE-DM) 6.25-15 MG/5ML syrup Take 5 mLs by mouth 4 (four) times daily as needed for cough. 118 mL Caysen Whang G, DO   albuterol (VENTOLIN HFA) 108 (90 Base) MCG/ACT inhaler Inhale 1-2 puffs into the lungs every 6 (six) hours as needed for wheezing or shortness of breath. 18 g Coral Spikes, DO     PDMP not reviewed this encounter.   Thersa Salt [place], [place] 05/16/20 [phone]

## 2020-05-22 ENCOUNTER — Telehealth: Payer: Self-pay

## 2020-05-22 NOTE — Telephone Encounter (Unsigned)
Copied from CRM [id number]. Topic: General - Other >> May 22, 2020 10:51 AM Gwenlyn Fudge wrote: Reason for CRM: Pt called stating that she would like to set up a New Patient appt with Dr. Yetta Barre. Pt states that she tested positive for covid last week. Please advise.

## 2020-05-22 NOTE — Telephone Encounter (Signed)
No, we don't do that

## 2020-05-22 NOTE — Telephone Encounter (Signed)
Called pt, waiting for call back

## 2020-06-05 ENCOUNTER — Telehealth: Payer: Self-pay

## 2020-06-05 NOTE — Telephone Encounter (Signed)
Copied from CRM [id number]. Topic: Appointment Scheduling - Scheduling Inquiry for Clinic >> Jun 05, 2020 11:53 AM [name] wrote: Reason for CRM: FU with pt re sch wants to see Amy Joyce, [facility] tested [facility] cov[facility] Jan 18, pneumonia both lungs, now her head is all congested and she starting to have a little SOB, thinks it is coming back again and wants to have dr look at her lungs. Not standard flow for DT please fu wit pt at [phone]

## 2020-06-05 NOTE — Telephone Encounter (Signed)
Patient will follow up with different provider elsewhere.

## 2020-10-11 DIAGNOSIS — Z833 Family history of diabetes mellitus: Secondary | ICD-10-CM | POA: Insufficient documentation

## 2020-10-11 DIAGNOSIS — Z0000 Encounter for general adult medical examination without abnormal findings: Secondary | ICD-10-CM | POA: Insufficient documentation

## 2020-10-11 DIAGNOSIS — L659 Nonscarring hair loss, unspecified: Secondary | ICD-10-CM | POA: Insufficient documentation

## 2020-10-11 DIAGNOSIS — E669 Obesity, unspecified: Secondary | ICD-10-CM | POA: Insufficient documentation

## 2020-11-22 DIAGNOSIS — R7303 Prediabetes: Secondary | ICD-10-CM | POA: Insufficient documentation

## 2021-03-19 DIAGNOSIS — M25512 Pain in left shoulder: Secondary | ICD-10-CM | POA: Insufficient documentation

## 2021-10-24 DIAGNOSIS — Z806 Family history of leukemia: Secondary | ICD-10-CM | POA: Insufficient documentation

## 2021-10-24 DIAGNOSIS — R21 Rash and other nonspecific skin eruption: Secondary | ICD-10-CM | POA: Insufficient documentation

## 2021-10-31 ENCOUNTER — Ambulatory Visit
Admission: EM | Admit: 2021-10-31 | Discharge: 2021-10-31 | Disposition: A | Discharge status: Home / Self Care | Payer: BC Managed Care – PPO | Attending: Emergency Medicine | Admitting: Emergency Medicine

## 2021-10-31 ENCOUNTER — Encounter: Payer: Self-pay | Admitting: Emergency Medicine

## 2021-10-31 DIAGNOSIS — S46812A Strain of other muscles, fascia and tendons at shoulder and upper arm level, left arm, initial encounter: Secondary | ICD-10-CM

## 2021-10-31 MED ORDER — KETOROLAC TROMETHAMINE 60 MG/2ML IM SOLN
30.0000 mg | Freq: Once | INTRAMUSCULAR | Status: AC
  Administered 2021-10-31: 30 mg via INTRAMUSCULAR

## 2021-10-31 MED ORDER — CYCLOBENZAPRINE HCL 10 MG PO TABS: 10.0000 mg | ORAL_TABLET | Freq: Every day | ORAL | 0 refills | Status: AC

## 2021-10-31 NOTE — ED Provider Notes (Signed)
[facility]    CSN: [id number] Arrival date & time: 10/31/21  0817      History   Chief Complaint Chief Complaint  Patient presents with   Shoulder Pain    HPI Amy Joyce is a 56 y.o. female.   Patient presents with left-sided shoulder pain beginning 1 day ago.  Symptoms started abruptly, denies any precipitating event, trauma or injury, changes to day-to-day activity.  Feels as if there is a knot over the shoulder.  And is rated a 10 out of 10, described as a radiating and severe pain.  Pain radiates down the posterior arm and endorses numbness to fingers 1 through 4.  Symptoms are worsened by long periods of sitting and lying, interferes with sleep.  Has attempted use of oxycodone, Benadryl, Aleve and Advil which have been somewhat helpful.  Endorses that she had similar symptoms in the past and a medication was injected into the muscle directly behind the arm and symptoms were relieved.    Past Medical History:  Diagnosis Date   Depression    Obesity     Patient Active Problem List   Diagnosis Date Noted   ADD (attention deficit disorder) 07/25/2015   Mixed hyperlipidemia 04/19/2015   Weight gain 04/19/2015   Bilateral hand pain 04/19/2015   Generalized anxiety disorder 04/19/2015   Pruritus 04/19/2015    Past Surgical History:  Procedure Laterality Date   APPENDECTOMY     NASAL SEPTUM SURGERY     TONSILLECTOMY AND ADENOIDECTOMY     TUBAL LIGATION      OB History   No obstetric history on file.      Home Medications    Prior to Admission medications   Medication Sig Start Date End Date Taking? Authorizing Provider  albuterol (VENTOLIN HFA) 108 (90 Base) MCG/ACT inhaler Inhale 1-2 puffs into the lungs every 6 (six) hours as needed for wheezing or shortness of breath. 05/15/20   Coral Spikes, DO  amoxicillin-clavulanate (AUGMENTIN) 875-125 MG tablet Take 1 tablet by mouth 2 (two) times daily. 05/15/20   Coral Spikes, DO  azithromycin  (ZITHROMAX) 250 MG tablet 2 tablets on day 1, then 1 tablet daily on days 2-5. 05/15/20   Coral Spikes, DO  Biotin w/ Vitamins C & E (HAIR/SKIN/NAILS PO) Take by mouth.    [provider]  dexamethasone (DECADRON) 6 MG tablet Take 1 tablet (6 mg total) by mouth daily. 05/15/20   Coral Spikes, DO  promethazine-dextromethorphan (PROMETHAZINE-DM) 6.25-15 MG/5ML syrup Take 5 mLs by mouth 4 (four) times daily as needed for cough. 05/15/20   Coral Spikes, DO  buPROPion (WELLBUTRIN XL) 150 MG 24 hr tablet Take 3 tablets (450 mg total) by mouth daily. 12/08/17 05/15/20  Glean Hess, MD  Calcium Citrate-Vitamin D (CALCIUM CITRATE + D3 PO) Take by mouth.  05/15/20  [provider]  phentermine (ADIPEX-P) 37.5 MG tablet Take 1 tablet (37.5 mg total) by mouth daily before breakfast. 09/08/17 05/15/20  Glean Hess, MD    Family History Family History  Problem Relation Age of Onset   Leukemia Mother    Multiple myeloma Father    Diabetes Father    Breast cancer Neg Hx     Social History Social History   Tobacco Use   Smoking status: Former    Types: Cigarettes    Quit date: 04/22/2004    Years since quitting: 17.5   Smokeless tobacco: Never  Vaping Use   Vaping  Use: Never used  Substance Use Topics   Alcohol use: Yes   Drug use: No     Allergies   Oxycodone and Oxycontin [oxycodone hcl]   Review of Systems Review of Systems Defer to HPI    Physical Exam Triage Vital Signs ED Triage Vitals  Enc Vitals Group     BP 10/31/21 0846 99/66     Pulse Rate 10/31/21 0846 78     Resp 10/31/21 0846 16     Temp 10/31/21 0846 97.8 F (36.6 C)     Temp Source 10/31/21 0846 Oral     SpO2 10/31/21 0846 100 %     Weight --      Height --      Head Circumference --      Peak Flow --      Pain Score 10/31/21 0844 10     Pain Loc --      Pain Edu? --      Excl. in [place]? --    No data found.  Updated Vital Signs BP 99/66 (BP Location: Right Arm)   Pulse 78   Temp  97.8 F (36.6 C) (Oral)   Resp 16   SpO2 100%   Visual Acuity Right Eye Distance:   Left Eye Distance:   Bilateral Distance:    Right Eye Near:   Left Eye Near:    Bilateral Near:     Physical Exam Constitutional:      Appearance: Normal appearance.  HENT:     Head: Normocephalic.  Eyes:     Extraocular Movements: Extraocular movements intact.  Musculoskeletal:     Comments: Tenderness is present along the left trapezius muscle, no direct tenderness over the shoulder joint, range of motion of the shoulder is intact, 2+ brachial pulse, strength is a 5 out of 5  Neurological:     Mental Status: She is alert and oriented to person, place, and time. Mental status is at baseline.  Psychiatric:        Behavior: Behavior normal.      UC Treatments / Results  Labs (all labs ordered are listed, but only abnormal results are displayed) Labs Reviewed - No data to display  EKG   Radiology No results found.  Procedures Procedures (including critical care time)  Medications Ordered in UC Medications - No data to display  Initial Impression / Assessment and Plan / UC Course  I have reviewed the triage vital signs and the nursing notes.  Pertinent labs & imaging results that were available during my care of the patient were reviewed by me and considered in my medical decision making (see chart for details).  Left trapezius muscle strain, initial encounter  Etiology is most likely muscular, discussed with patient, Toradol injection given in office and given written dosages for ibuprofen to take at home consistently for the next 5 days, Flexeril prescribed for outpatient management, recommended RICE, heat, daily stretching and activity as tolerated, given walking referral to orthopedics if symptoms persist or worsen,  Final Clinical Impressions(s) / UC Diagnoses   Final diagnoses:  None   Discharge Instructions   None    ED Prescriptions   None    PDMP not reviewed  this encounter.   Hans Eden, NP 10/31/21 1027

## 2021-10-31 NOTE — Discharge Instructions (Addendum)
Your pain is most likely caused by irritation to the muscles.  Begin use 800 mg of Advil 3 times daily (every 8 hours) for 5 days then may use as needed  You may use muscle relaxer at bedtime to help you rest, be mindful this medication may make you drowsy  You may use heating pad in 15 minute intervals as needed for additional comfort, or you may find comfort in using ice in 10-15 minutes over affected area  Begin stretching affected area daily for 10 minutes as tolerated to further loosen muscles   When lying down place pillow underneath and between knees for support  Can try sleeping without pillow on firm mattress   Practice good posture: head back, shoulders back, chest forward, pelvis back and weight distributed evenly on both legs  If pain persist after recommended treatment or reoccurs if may be beneficial to follow up with orthopedic specialist for evaluation, this doctor specializes in the bones and can manage your symptoms long-term with options such as but not limited to imaging, medications or physical therapy

## 2021-10-31 NOTE — ED Triage Notes (Signed)
Pt reports pain right under shoulder blade radiating into left arm beginning yesterday. States it feels like she was walking on crutches and leaning on them. States she has had similar pain in the past and was given a shot in the muscle and it helped.

## 2022-08-13 DIAGNOSIS — I959 Hypotension, unspecified: Secondary | ICD-10-CM | POA: Insufficient documentation

## 2023-10-14 ENCOUNTER — Ambulatory Visit
Admission: EM | Admit: 2023-10-14 | Discharge: 2023-10-14 | Disposition: A | Discharge status: Home / Self Care | Attending: Emergency Medicine | Admitting: Emergency Medicine

## 2023-10-14 ENCOUNTER — Emergency Department
Admission: EM | Admit: 2023-10-14 | Discharge: 2023-10-14 | Disposition: A | Discharge status: Home / Self Care | Attending: Emergency Medicine | Admitting: Emergency Medicine

## 2023-10-14 ENCOUNTER — Other Ambulatory Visit: Payer: Self-pay

## 2023-10-14 ENCOUNTER — Emergency Department

## 2023-10-14 ENCOUNTER — Encounter: Payer: Self-pay | Admitting: Intensive Care

## 2023-10-14 DIAGNOSIS — S41111A Laceration without foreign body of right upper arm, initial encounter: Secondary | ICD-10-CM

## 2023-10-14 DIAGNOSIS — S41151A Open bite of right upper arm, initial encounter: Secondary | ICD-10-CM | POA: Diagnosis not present

## 2023-10-14 DIAGNOSIS — S51051A Open bite, right elbow, initial encounter: Secondary | ICD-10-CM | POA: Insufficient documentation

## 2023-10-14 DIAGNOSIS — S51011A Laceration without foreign body of right elbow, initial encounter: Secondary | ICD-10-CM | POA: Diagnosis not present

## 2023-10-14 DIAGNOSIS — W540XXA Bitten by dog, initial encounter: Secondary | ICD-10-CM

## 2023-10-14 MED ORDER — OXYCODONE HCL 5 MG PO TABS
5.0000 mg | ORAL_TABLET | Freq: Once | ORAL | Status: AC
  Administered 2023-10-14: 5 mg via ORAL
  Filled 2023-10-14: qty 1

## 2023-10-14 MED ORDER — ACETAMINOPHEN 325 MG PO TABS
975.0000 mg | ORAL_TABLET | Freq: Once | ORAL | Status: AC
  Administered 2023-10-14: 975 mg via ORAL

## 2023-10-14 MED ORDER — LIDOCAINE HCL (PF) 1 % IJ SOLN
5.0000 mL | Freq: Once | INTRAMUSCULAR | Status: AC
  Administered 2023-10-14: 5 mL via INTRADERMAL
  Filled 2023-10-14: qty 5

## 2023-10-14 MED ORDER — AMOXICILLIN-POT CLAVULANATE 875-125 MG PO TABS: 1.0000 | ORAL_TABLET | Freq: Two times a day (BID) | ORAL | 0 refills | Status: AC

## 2023-10-14 MED ORDER — KETOROLAC TROMETHAMINE 30 MG/ML IJ SOLN
30.0000 mg | Freq: Once | INTRAMUSCULAR | Status: AC
  Administered 2023-10-14: 30 mg via INTRAMUSCULAR

## 2023-10-14 NOTE — ED Provider Notes (Signed)
 HPI  SUBJECTIVE:  Amy Joyce is a right-handed 58 y.o. female who presents with a dog bite to her right elbow sustained while trying to break up a dog fight between her 2 dogs at 1400 today.  She is not sure if there is any foreign body sensation.  She reports constant, throbbing severe pain, inability to move her elbow, swelling and surrounding erythema.  Both dogs' rabies vaccinations are up-to-date.  Her last tetanus was in 2022.  She has not tried anything for symptoms, but came immediately here.  No alleviating factors.  Symptoms are worse when she attempts to move her elbow.  She has no past medical history.  PCP: [facility].  Past Medical History:  Diagnosis Date   Depression    Obesity     Past Surgical History:  Procedure Laterality Date   APPENDECTOMY     NASAL SEPTUM SURGERY     TONSILLECTOMY AND ADENOIDECTOMY     TUBAL LIGATION      Family History  Problem Relation Age of Onset   Leukemia Mother    Multiple myeloma Father    Diabetes Father    Breast cancer Neg Hx     Social History   Tobacco Use   Smoking status: Former    Current packs/day: 0.00    Types: Cigarettes    Quit date: 04/22/2004    Years since quitting: 19.4   Smokeless tobacco: Never  Vaping Use   Vaping status: Never Used  Substance Use Topics   Alcohol use: Yes   Drug use: No    No current facility-administered medications for this encounter.  Current Outpatient Medications:    phentermine  (ADIPEX-P ) 37.5 MG tablet, PHENTERMINE  HCL 37.5 MG TABS, Disp: , Rfl:    albuterol  (VENTOLIN  HFA) 108 (90 Base) MCG/ACT inhaler, Inhale 1-2 puffs into the lungs every 6 (six) hours as needed for wheezing or shortness of breath., Disp: 18 g, Rfl: 0   amoxicillin -clavulanate (AUGMENTIN ) 875-125 MG tablet, Take 1 tablet by mouth 2 (two) times daily., Disp: 20 tablet, Rfl: 0   azithromycin  (ZITHROMAX ) 250 MG tablet, 2 tablets on day 1, then 1 tablet daily on days 2-5., Disp: 6 tablet, Rfl:  0   Biotin w/ Vitamins C & E (HAIR/SKIN/NAILS PO), Take by mouth., Disp: , Rfl:    cyclobenzaprine  (FLEXERIL ) 10 MG tablet, Take 1 tablet (10 mg total) by mouth at bedtime., Disp: 10 tablet, Rfl: 0   dexamethasone  (DECADRON ) 6 MG tablet, Take 1 tablet (6 mg total) by mouth daily., Disp: 10 tablet, Rfl: 0   promethazine -dextromethorphan (PROMETHAZINE -DM) 6.25-15 MG/5ML syrup, Take 5 mLs by mouth 4 (four) times daily as needed for cough., Disp: 118 mL, Rfl: 0  Allergies  Allergen Reactions   Oxycodone Itching   Oxycontin [Oxycodone Hcl] Itching     ROS  As noted in HPI.   Physical Exam  BP 103/65 (BP Location: Left Arm)   Pulse 89   Temp 98.9 F (37.2 C) (Oral)   Resp 16   Ht 5' 7 (1.702 m)   Wt 72.6 kg   SpO2 99%   BMI 25.06 kg/m   Constitutional: Well developed, well nourished, in severe painful distress Eyes:  EOMI, conjunctiva normal bilaterally HENT: Normocephalic, atraumatic,mucus membranes moist Respiratory: Normal inspiratory effort Cardiovascular: Normal rate GI: nondistended skin:   3 cm deep laceration over the olecranon.  Patient unable to move her elbow through any range of motion. 0.5 cm puncture wound over lateral epicondyle.   1  cm gaping wound medial epicondyle.  0.5 cm puncture wound distal bicep.   Musculoskeletal: RP 2+.  Sensation and motor intact distally in the median/radial/ulnar distribution. Neurologic: Alert & oriented x 3, no focal neuro deficits Psychiatric: Speech and behavior appropriate   ED Course   Medications  acetaminophen  (TYLENOL ) tablet 975 mg (975 mg Oral Given 10/14/23 1511)  ketorolac  (TORADOL ) 30 MG/ML injection 30 mg (30 mg Intramuscular Given 10/14/23 1511)    Orders Placed This Encounter  Procedures   Apply ace wrap    Standing Status:   Standing    Number of Occurrences:   1    No results found for this or any previous visit (from the past 24 hours). No results found.  ED Clinical Impression  1. Dog  bite, initial encounter      ED Assessment/Plan     Right-handed patient presents with a severe dog bite and laceration to her right elbow.  She is in a significant amount of pain.  I am concerned that the bite may have violated the joint capsule.  At this point in time, there is no evidence of compartment syndrome.  She is neurovascularly intact distally.  There also may be a retained foreign body.  I am unable to adequately examine her as I am unable to get her pain under control, so I cannot determine if there is any tendon involvement.  Gave 30 mg of Toradol  IM, 975 mg of Tylenol  p.o.  Loosely Steri-Stripped the laceration to her elbow, and wrapped with an Ace wrap for support.  Transferring to the emergency department for pain control, further evaluation and possible specialty consultation.  She is stable to go by private vehicle.  Gave report to charge nurse.  Discussed rationale for transfer to the emergency department with the patient.  She agrees to go.  Meds ordered this encounter  Medications   acetaminophen  (TYLENOL ) tablet 975 mg   ketorolac  (TORADOL ) 30 MG/ML injection 30 mg      *This clinic note was created using Scientist, clinical (histocompatibility and immunogenetics). Therefore, there may be occasional mistakes despite careful proofreading.  ?    Van Knee, MD 10/14/23 1535

## 2023-10-14 NOTE — Discharge Instructions (Addendum)
 Stitches will need to be removed in about 10 days.  Please wash the wounds with soap and water daily.  Then cover with a bandage.  If the Band-Aid over the sutured sites becomes wet please change for out for a dry 1.  Watch for signs of infection including redness, warmth, swelling, pain and pus drainage.  If you develop any of these please return to the ED, urgent care or your primary care provider.  Please take the antibiotics as prescribed.  Follow-up with orthopedics, their information is attached.  Call to schedule.

## 2023-10-14 NOTE — Discharge Instructions (Signed)
 You have sustained a severe dog bite to the elbow of your dominant hand.  I am concerned that the bite may have violated the joint capsule.  I am also concerned that I am not going to adequately be able to get your pain under control.  This is going to require extensive irrigation to prevent infection.  I have given you 30 mg of Toradol  and 975 mg of Tylenol .  Please go to the emergency department for comprehensive evaluation and possible specialty consultation if warranted.

## 2023-10-14 NOTE — ED Provider Notes (Signed)
 [facility] Provider Note    Event Date/Time   First MD Initiated Contact with Patient 10/14/23 1621     (approximate)   History   Animal Bite   HPI  Amy Joyce is a 58 y.o. female with PMH of depression was sent over from urgent care for evaluation of a dog bite to the right arm.  Patient's dogs got into a fight and she broke it up.  Her dogs are up-to-date on their vaccinations.  She sustained a large and deep laceration over the olecranon and a few puncture wounds to the medial side of the arm.   I reviewed the note from the urgent care provider who is concerned about possible violation of the joint capsule.  She was given Toradol  and Tylenol  at urgent care.      Physical Exam   Triage Vital Signs: ED Triage Vitals  Encounter Vitals Group     BP 10/14/23 1621 121/70     Girls Systolic BP Percentile --      Girls Diastolic BP Percentile --      Boys Systolic BP Percentile --      Boys Diastolic BP Percentile --      Pulse Rate 10/14/23 1621 90     Resp 10/14/23 1621 18     Temp 10/14/23 1617 98.5 F (36.9 C)     Temp Source 10/14/23 1617 Oral     SpO2 10/14/23 1621 94 %     Weight 10/14/23 1618 160 lb (72.6 kg)     Height 10/14/23 1618 5' 7 (1.702 m)     Head Circumference --      Peak Flow --      Pain Score 10/14/23 1618 8     Pain Loc --      Pain Education --      Exclude from Growth Chart --     Most recent vital signs: Vitals:   10/14/23 1617 10/14/23 1621  BP:  121/70  Pulse:  90  Resp:  18  Temp: 98.5 F (36.9 C)   SpO2:  94%   General: Awake, no distress.  CV:  Good peripheral perfusion.  Resp:  Normal effort.  Abd:  No distention.  Other:  Approximately 3 cm laceration over the olecranon process, secured with Steri-Strips, puncture wound over the medial epicondyle approximately 1 cm long, other superficial laceration to the medial side of the upper arm less than 1 cm, see media portion of patient's chart   ED  Results / Procedures / Treatments   Labs (all labs ordered are listed, but only abnormal results are displayed) Labs Reviewed - No data to display  RADIOLOGY  Right elbow x-ray obtained, interpreted the images as well as reviewed the radiologist report which was negative for fractures and retained foreign body but did show soft tissue injury.   PROCEDURES:  Critical Care performed: No  .Laceration Repair  Date/Time: 10/14/2023 6:53 PM  Performed by: Cleaster Tinnie LABOR, PA-C Authorized by: Cleaster Tinnie LABOR, PA-C   Consent:    Consent obtained:  Verbal   Consent given by:  Patient   Risks, benefits, and alternatives were discussed: yes     Risks discussed:  Infection, pain, poor cosmetic result and poor wound healing   Alternatives discussed:  No treatment and delayed treatment Universal protocol:    Patient identity confirmed:  Verbally with patient Anesthesia:    Anesthesia method:  Local infiltration   Local anesthetic:  Lidocaine 1%  w/o epi Laceration details:    Location:  Shoulder/arm   Shoulder/arm location:  R elbow   Length (cm):  5 (elbow laceration is 4 cm, medial upper arm laceration is 1 cm)   Depth (mm):  10 Pre-procedure details:    Preparation:  Patient was prepped and draped in usual sterile fashion and imaging obtained to evaluate for foreign bodies Exploration:    Imaging obtained: x-ray     Imaging outcome: foreign body not noted     Wound exploration: wound explored through full range of motion and entire depth of wound visualized     Contaminated: no   Treatment:    Area cleansed with:  Povidone-iodine   Amount of cleaning:  Standard   Irrigation solution:  Sterile saline   Irrigation method:  Syringe Skin repair:    Repair method:  Sutures   Suture size:  4-0   Suture material:  Prolene   Suture technique:  Simple interrupted   Number of sutures:  4 (3 over elbow laceration and 1 on the laceration over medial side) Approximation:     Approximation:  Close Repair type:    Repair type:  Simple Post-procedure details:    Dressing:  Adhesive bandage   Procedure completion:  Tolerated well, no immediate complications    MEDICATIONS ORDERED IN ED: Medications  lidocaine (PF) (XYLOCAINE) 1 % injection 5 mL (has no administration in time range)  lidocaine (PF) (XYLOCAINE) 1 % injection 5 mL (has no administration in time range)  oxyCODONE (Oxy IR/ROXICODONE) immediate release tablet 5 mg (5 mg Oral Given 10/14/23 1744)     IMPRESSION / MDM / ASSESSMENT AND PLAN / ED COURSE  I reviewed the triage vital signs and the nursing notes.                             58 year old female presents for evaluation of dog bite wound to the right elbow.  Vital signs are stable patient is quite uncomfortable on exam.  Differential diagnosis includes, but is not limited to, retained foreign body, fracture, joint capsule injury, laceration.  Patient's presentation is most consistent with acute complicated illness / injury requiring diagnostic workup.  Patient was bit by her own dogs which were up-to-date on their vaccinations so patient does not need rabies prophylaxis today.  She is also up-to-date on her tetanus shot.  X-ray of the right elbow obtained to evaluate for fractures and retained foreign bodies which was negative but did show soft tissue injury.  Discussed laceration repair with the patient.  The wound over the elbow is quite large and given that it is over the joint feel it is best to close loosely with sutures. Wound was explored extensively by both myself and my attending, do not believe there is joint capsule involvement. I also placed 1 suture in the puncture wound on the medial side of the arm as this was gaping open and bleeding.  See procedure note for further details.  I explained to the patient that dog bite wounds are high risk for infection.  We discussed cleaning with soap and water daily.  We reviewed signs of  infection to watch for, including development of fever and inability to move the elbow.  Will also start her on oral antibiotics.  Will have her follow-up with orthopedics.  She can take Tylenol , ibuprofen  and ice as needed for pain.  She voiced understanding, all questions were answered and she  is stable at discharge.      FINAL CLINICAL IMPRESSION(S) / ED DIAGNOSES   Final diagnoses:  Dog bite, initial encounter  Laceration of right elbow, initial encounter  Laceration of multiple sites of right upper arm, initial encounter     Rx / DC Orders   ED Discharge Orders          Ordered    amoxicillin -clavulanate (AUGMENTIN ) 875-125 MG tablet  2 times daily        10/14/23 1901             Note:  This document was prepared using Dragon voice recognition software and may include unintentional dictation errors.   Cleaster Tinnie LABOR, PA-C 10/14/23 1903    Claudene Rover, MD 10/14/23 [phone]

## 2023-10-14 NOTE — ED Triage Notes (Signed)
 Pt presents to UC for dog bite in R arm. Dog is hers & UTD w/vaccines. Last tetanus 10/11/20.

## 2023-10-14 NOTE — ED Triage Notes (Signed)
 Patient presents with dog bite to right arm. Patient is the Therapist, art. Up to date with vaccinations per patient.   Patient given Toradol  and tylenol  at [facility] prior to arrival.   Patient has paper from UC explaining why sent to ER
